# Patient Record
Sex: Male | Born: 1950
Health system: Southern US, Community
[De-identification: ages and names within clinical notes are randomized; demographics above are authoritative.]

## PROBLEM LIST (undated history)

## (undated) DIAGNOSIS — M1990 Unspecified osteoarthritis, unspecified site: Secondary | ICD-10-CM

## (undated) DIAGNOSIS — E559 Vitamin D deficiency, unspecified: Secondary | ICD-10-CM

## (undated) DIAGNOSIS — K5790 Diverticulosis of intestine, part unspecified, without perforation or abscess without bleeding: Secondary | ICD-10-CM

## (undated) DIAGNOSIS — I499 Cardiac arrhythmia, unspecified: Secondary | ICD-10-CM

## (undated) DIAGNOSIS — I10 Essential (primary) hypertension: Secondary | ICD-10-CM

## (undated) DIAGNOSIS — G4730 Sleep apnea, unspecified: Secondary | ICD-10-CM

## (undated) DIAGNOSIS — M109 Gout, unspecified: Secondary | ICD-10-CM

## (undated) DIAGNOSIS — E785 Hyperlipidemia, unspecified: Secondary | ICD-10-CM

## (undated) DIAGNOSIS — K219 Gastro-esophageal reflux disease without esophagitis: Secondary | ICD-10-CM

## (undated) HISTORY — DX: Gastro-esophageal reflux disease without esophagitis: K21.9

## (undated) HISTORY — DX: Unspecified osteoarthritis, unspecified site: M19.90

## (undated) HISTORY — DX: Sleep apnea, unspecified: G47.30

## (undated) HISTORY — DX: Cardiac arrhythmia, unspecified: I49.9

## (undated) HISTORY — DX: Diverticulosis of intestine, part unspecified, without perforation or abscess without bleeding: K57.90

## (undated) HISTORY — DX: Essential (primary) hypertension: I10

## (undated) HISTORY — DX: Hyperlipidemia, unspecified: E78.5

## (undated) HISTORY — DX: Vitamin D deficiency, unspecified: E55.9

---

## 1991-01-18 HISTORY — PX: EYE SURGERY: SHX253

## 2005-01-21 ENCOUNTER — Emergency Department (HOSPITAL_COMMUNITY): Admission: EM | Admit: 2005-01-21 | Discharge: 2005-01-21 | Payer: Self-pay | Admitting: Emergency Medicine

## 2005-05-02 ENCOUNTER — Emergency Department (HOSPITAL_COMMUNITY): Admission: EM | Admit: 2005-05-02 | Discharge: 2005-05-02 | Payer: Self-pay | Admitting: Family Medicine

## 2008-07-31 ENCOUNTER — Emergency Department (HOSPITAL_COMMUNITY): Admission: EM | Admit: 2008-07-31 | Discharge: 2008-07-31 | Payer: Self-pay | Admitting: Emergency Medicine

## 2010-03-13 ENCOUNTER — Inpatient Hospital Stay (INDEPENDENT_AMBULATORY_CARE_PROVIDER_SITE_OTHER)
Admission: RE | Admit: 2010-03-13 | Discharge: 2010-03-13 | Disposition: A | Discharge status: Home / Self Care | Payer: BC Managed Care – PPO | Source: Ambulatory Visit | Attending: Family Medicine | Admitting: Family Medicine

## 2010-03-13 DIAGNOSIS — L0291 Cutaneous abscess, unspecified: Secondary | ICD-10-CM

## 2010-03-13 DIAGNOSIS — L723 Sebaceous cyst: Secondary | ICD-10-CM

## 2013-02-28 ENCOUNTER — Encounter: Payer: Self-pay | Admitting: Gastroenterology

## 2013-03-25 ENCOUNTER — Ambulatory Visit (AMBULATORY_SURGERY_CENTER): Payer: Self-pay

## 2013-03-25 VITALS — Ht 65.0 in | Wt 185.8 lb

## 2013-03-25 DIAGNOSIS — Z800 Family history of malignant neoplasm of digestive organs: Secondary | ICD-10-CM

## 2013-03-25 MED ORDER — MOVIPREP 100 G PO SOLR: 1.0000 | Freq: Once | ORAL | Status: DC

## 2013-04-08 ENCOUNTER — Encounter: Payer: Self-pay | Admitting: Gastroenterology

## 2013-04-08 ENCOUNTER — Ambulatory Visit (AMBULATORY_SURGERY_CENTER): Payer: PRIVATE HEALTH INSURANCE | Admitting: Gastroenterology

## 2013-04-08 VITALS — BP 117/97 | HR 69 | Temp 97.1°F | Resp 12 | Ht 65.0 in | Wt 185.0 lb

## 2013-04-08 DIAGNOSIS — Z800 Family history of malignant neoplasm of digestive organs: Secondary | ICD-10-CM

## 2013-04-08 DIAGNOSIS — K5730 Diverticulosis of large intestine without perforation or abscess without bleeding: Secondary | ICD-10-CM

## 2013-04-08 DIAGNOSIS — Z1211 Encounter for screening for malignant neoplasm of colon: Secondary | ICD-10-CM

## 2013-04-08 MED ORDER — SODIUM CHLORIDE 0.9 % IV SOLN: 500.0000 mL | INTRAVENOUS | Status: DC

## 2013-04-08 NOTE — Progress Notes (Signed)
Report to pacu, rn, vss, bbs=clear

## 2013-04-08 NOTE — Op Note (Signed)
[facility] [address] [place], [id number]   COLONOSCOPY PROCEDURE REPORT  PATIENT: Cherie OuchRoach, Steven  MR#: [id number] BIRTHDATE: 10/10/1950 , 63  yrs. old GENDER: Male ENDOSCOPIST: Rachael Feeaniel P Majesty Stehlin, MD REFERRED QI:ONGEXBMBY:Richard Ricki MillerPang, M.D. PROCEDURE DATE:  04/08/2013 PROCEDURE:   Colonoscopy, screening First Screening Colonoscopy - Avg.  risk and is 50 yrs.  old or older - No.  Prior Negative Screening - Now for repeat screening. N/A  History of Adenoma - Now for follow-up colonoscopy & has been > or = to 3 yrs.  N/A  Polyps Removed Today? No.  Recommend repeat exam, <10 yrs? Yes.  High risk (family or personal hx). ASA CLASS:   Class II INDICATIONS:elevated risk screening, father had colon cancer MEDICATIONS: MAC sedation, administered by CRNA and Propofol (Diprivan) 230 mg IV  DESCRIPTION OF PROCEDURE:   After the risks benefits and alternatives of the procedure were thoroughly explained, informed consent was obtained.  A digital rectal exam revealed no abnormalities of the rectum.   The LB [id number]  endoscope was introduced through the anus and advanced to the cecum, which was identified by both the appendix and ileocecal valve. No adverse events experienced.   The quality of the prep was excellent.  The instrument was then slowly withdrawn as the colon was fully examined.  COLON FINDINGS: There were a few small diverticulum in the left colon.  The examination was otherwise normal.  Retroflexed views revealed no abnormalities. The time to cecum=0 minutes 52 seconds. Withdrawal time=8 minutes 02 seconds.  The scope was withdrawn and the procedure completed. COMPLICATIONS: There were no complications.  ENDOSCOPIC IMPRESSION: There were a few small diverticulum in the left colon. The examination was otherwise normal.  RECOMMENDATIONS: Given your significant family history of colon cancer (father), you should have a repeat colonoscopy in 5  years   eSigned:  Rachael Feeaniel P Sharia Averitt, MD 04/08/2013 11:09 AM

## 2013-04-08 NOTE — Patient Instructions (Signed)
YOU HAD AN ENDOSCOPIC PROCEDURE TODAY AT THE [facility]: Refer to the procedure report that was given to you for any specific questions about what was found during the examination.  If the procedure report does not answer your questions, please call your gastroenterologist to clarify.  If you requested that your care partner not be given the details of your procedure findings, then the procedure report has been included in a sealed envelope for you to review at your convenience later.  YOU SHOULD EXPECT: Some feelings of bloating in the abdomen. Passage of more gas than usual.  Walking can help get rid of the air that was put into your GI tract during the procedure and reduce the bloating. If you had a lower endoscopy (such as a colonoscopy or flexible sigmoidoscopy) you may notice spotting of blood in your stool or on the toilet paper. If you underwent a bowel prep for your procedure, then you may not have a normal bowel movement for a few days.  DIET: Your first meal following the procedure should be a light meal and then it is ok to progress to your normal diet.  A half-sandwich or bowl of soup is an example of a good first meal.  Heavy or fried foods are harder to digest and may make you feel nauseous or bloated.  Likewise meals heavy in dairy and vegetables can cause extra gas to form and this can also increase the bloating.  Drink plenty of fluids but you should avoid alcoholic beverages for 24 hours.  ACTIVITY: Your care partner should take you home directly after the procedure.  You should plan to take it easy, moving slowly for the rest of the day.  You can resume normal activity the day after the procedure however you should NOT DRIVE or use heavy machinery for 24 hours (because of the sedation medicines used during the test).    SYMPTOMS TO REPORT IMMEDIATELY: A gastroenterologist can be reached at any hour.  During normal business hours, 8:30 AM to 5:00 PM Monday through Friday,  call [phone].  After hours and on weekends, please call the GI answering service at [phone] who will take a message and have the physician on call contact you.   Following lower endoscopy (colonoscopy or flexible sigmoidoscopy):  Excessive amounts of blood in the stool  Significant tenderness or worsening of abdominal pains  Swelling of the abdomen that is new, acute  Fever of 100F or higher  FOLLOW UP: If any biopsies were taken you will be contacted by phone or by letter within the next 1-3 weeks.  Call your gastroenterologist if you have not heard about the biopsies in 3 weeks.  Our staff will call the home number listed on your records the next business day following your procedure to check on you and address any questions or concerns that you may have at that time regarding the information given to you following your procedure. This is a courtesy call and so if there is no answer at the home number and we have not heard from you through the emergency physician on call, we will assume that you have returned to your regular daily activities without incident.  SIGNATURES/CONFIDENTIALITY: You and/or your care partner have signed paperwork which will be entered into your electronic medical record.  These signatures attest to the fact that that the information above on your After Visit Summary has been reviewed and is understood.  Full responsibility of the confidentiality of this   discharge information lies with you and/or your care-partner.  Resume medications. Information given on diverticulosis and high fiber diet with discharge instructions. 

## 2013-04-09 ENCOUNTER — Telehealth: Payer: Self-pay

## 2013-04-09 NOTE — Telephone Encounter (Signed)
  Follow up Call-  Call back number 04/08/2013  Post procedure Call Back phone  # [phone]  Permission to leave phone message Yes     Patient questions:  Do you have a fever, pain , or abdominal swelling? no Pain Score  0 *  Have you tolerated food without any problems? yes  Have you been able to return to your normal activities? yes  Do you have any questions about your discharge instructions: Diet   no Medications  no Follow up visit  no  Do you have questions or concerns about your Care? no  Actions: * If pain score is 4 or above: No action needed, pain <4.

## 2014-08-16 ENCOUNTER — Encounter (HOSPITAL_COMMUNITY): Payer: Self-pay | Admitting: Emergency Medicine

## 2014-08-16 ENCOUNTER — Emergency Department (HOSPITAL_COMMUNITY)
Admission: EM | Admit: 2014-08-16 | Discharge: 2014-08-16 | Disposition: A | Discharge status: Home / Self Care | Payer: 59 | Attending: Emergency Medicine | Admitting: Emergency Medicine

## 2014-08-16 DIAGNOSIS — E559 Vitamin D deficiency, unspecified: Secondary | ICD-10-CM | POA: Diagnosis not present

## 2014-08-16 DIAGNOSIS — Z87891 Personal history of nicotine dependence: Secondary | ICD-10-CM | POA: Insufficient documentation

## 2014-08-16 DIAGNOSIS — M109 Gout, unspecified: Secondary | ICD-10-CM | POA: Diagnosis present

## 2014-08-16 DIAGNOSIS — I10 Essential (primary) hypertension: Secondary | ICD-10-CM | POA: Insufficient documentation

## 2014-08-16 DIAGNOSIS — M10072 Idiopathic gout, left ankle and foot: Secondary | ICD-10-CM | POA: Insufficient documentation

## 2014-08-16 DIAGNOSIS — Z79899 Other long term (current) drug therapy: Secondary | ICD-10-CM | POA: Insufficient documentation

## 2014-08-16 HISTORY — DX: Gout, unspecified: M10.9

## 2014-08-16 MED ORDER — IBUPROFEN 400 MG PO TABS
800.0000 mg | ORAL_TABLET | Freq: Once | ORAL | Status: AC
  Administered 2014-08-16: 800 mg via ORAL
  Filled 2014-08-16: qty 2

## 2014-08-16 MED ORDER — IBUPROFEN 600 MG PO TABS: 600.0000 mg | ORAL_TABLET | Freq: Two times a day (BID) | ORAL | Status: DC

## 2014-08-16 NOTE — ED Provider Notes (Signed)
CSN: [id number]     Arrival date & time 08/16/14  0700 History   First MD Initiated Contact with Patient 08/16/14 [phone]     Chief Complaint  Patient presents with  . Gout   HPI   64 year old male presents today with left great toe pain. Patient reports a significant past medical history of gout, was prescribed allopurinol by his primary care provider. Patient reports that for the last 3 days he's been experiencing excruciating left toe pain, described as stabbing, extremely painful to palpation or light touch. Patient reports this is identical to previous gout flares which she's had in the great toe. He notes that the allopurinol that his primary care provider prescribed him has not improved symptoms. Patient has not tried any other over-the-counter therapies. He denies fever, chills, nausea, vomiting, or any other significant acute concerning signs or symptoms today.    Past Medical History  Diagnosis Date  . Hypertension   . Vitamin D deficiency   . Gout    Past Surgical History  Procedure Laterality Date  . Eye surgery  1993    due to injury   Family History  Problem Relation Age of Onset  . Colon cancer Father    History  Substance Use Topics  . Smoking status: Former Games developer  . Smokeless tobacco: Never Used  . Alcohol Use: Yes     Comment: 2 times monthly "if that"    Review of Systems  All other systems reviewed and are negative.   Allergies  Review of patient's allergies indicates no known allergies.  Home Medications   Prior to Admission medications   Medication Sig Start Date End Date Taking? Authorizing Provider  Acetaminophen (TYLENOL ARTHRITIS PAIN PO) Take by mouth.    Historical Provider, MD  ibuprofen (ADVIL,MOTRIN) 600 MG tablet Take 1 tablet (600 mg total) by mouth 2 (two) times daily. 08/16/14   Eyvonne Mechanic, PA-C  olmesartan-hydrochlorothiazide (BENICAR HCT) 40-12.5 MG per tablet Take 1 tablet by mouth daily.    Historical Provider, MD  PRESCRIPTION  MEDICATION Vitamin d3 once monthly    Historical Provider, MD   BP 152/79 mmHg  Pulse 50  Temp(Src) 98.1 F (36.7 C) (Oral)  Resp 16  Ht  (1.651 m)  Wt 189 lb (85.73 kg)  BMI 31.45 kg/m2  SpO2 100%   Physical Exam  Constitutional: He is oriented to person, place, and time. He appears well-developed and well-nourished.  HENT:  Head: Normocephalic and atraumatic.  Eyes: Conjunctivae are normal. Pupils are equal, round, and reactive to light. Right eye exhibits no discharge. Left eye exhibits no discharge. No scleral icterus.  Neck: Normal range of motion. No JVD present. No tracheal deviation present.  Pulmonary/Chest: Effort normal. No stridor.  Musculoskeletal:  Mildly erythematous left great MTP, no obvious signs of swelling or soft tissue infection, Refill less than 3 seconds, tender to light palpation. Ankle nontender full active range of motion  Neurological: He is alert and oriented to person, place, and time. Coordination normal.  Psychiatric: He has a normal mood and affect. His behavior is normal. Judgment and thought content normal.  Nursing note and vitals reviewed.   ED Course  Procedures (including critical care time) Labs Review Labs Reviewed - No data to display  Imaging Review No results found.   EKG Interpretation None      MDM   Final diagnoses:  Acute gout of left foot, unspecified cause    Labs:  Imaging:  Consults:  Therapeutics: Ibuprofen  Discharge Meds:   Assessment/Plan: Patient presents with likely gout flare. He presented his allopurinol which she reports he's been taking for the flare, but notes that he has not been taking it on a daily basis. I informed him that this medication is only intended for daily use and not for acute flares. This is unlikely septic joint, cellulitis, or any other concerning pathology. Patient will be prescribed ibuprofen twice a day for 5 days, encouraged to discontinue allopurinol until symptoms have  resolved. His encouraged contact his primary care provider in 2 days for reevaluation. Strict return precautions given patient verbalizes understanding and agreement to today's plan and has no further questions concerns at the time of discharge.         Eyvonne Mechanic, PA-C 08/16/14 1610  Elwin Mocha, MD 08/16/14 [phone]

## 2014-08-16 NOTE — Discharge Instructions (Signed)

## 2014-08-16 NOTE — ED Notes (Signed)
Pt. Stated, I started having gou on Thursday , left foot.

## 2015-01-19 ENCOUNTER — Emergency Department (INDEPENDENT_AMBULATORY_CARE_PROVIDER_SITE_OTHER)
Admission: EM | Admit: 2015-01-19 | Discharge: 2015-01-19 | Disposition: A | Discharge status: Home / Self Care | Payer: Managed Care, Other (non HMO) | Source: Home / Self Care | Attending: Family Medicine | Admitting: Family Medicine

## 2015-01-19 ENCOUNTER — Encounter (HOSPITAL_COMMUNITY): Payer: Self-pay

## 2015-01-19 DIAGNOSIS — H53143 Visual discomfort, bilateral: Secondary | ICD-10-CM

## 2015-01-19 DIAGNOSIS — G44209 Tension-type headache, unspecified, not intractable: Secondary | ICD-10-CM

## 2015-01-19 DIAGNOSIS — H5713 Ocular pain, bilateral: Secondary | ICD-10-CM

## 2015-01-19 DIAGNOSIS — H5310 Unspecified subjective visual disturbances: Secondary | ICD-10-CM

## 2015-01-19 DIAGNOSIS — S161XXA Strain of muscle, fascia and tendon at neck level, initial encounter: Secondary | ICD-10-CM | POA: Diagnosis not present

## 2015-01-19 MED ORDER — DICLOFENAC SODIUM 1 % TD GEL: TRANSDERMAL | Status: DC

## 2015-01-19 MED ORDER — NAPROXEN 375 MG PO TABS: 375.0000 mg | ORAL_TABLET | Freq: Two times a day (BID) | ORAL | Status: DC

## 2015-01-19 NOTE — ED Notes (Signed)
Patient complains of headache neck pain and bilateral eye irritation Symptoms started about three weeks ago and his primary told him to take Allegra for two weeks and see if that helps Patient feels he is not any better

## 2015-01-19 NOTE — Discharge Instructions (Signed)
Cervical Strain and Sprain With Rehab  Cervical strain and sprain are injuries that commonly occur with "whiplash" injuries. Whiplash occurs when the neck is forcefully whipped backward or forward, such as during a motor vehicle accident or during contact sports. The muscles, ligaments, tendons, discs, and nerves of the neck are susceptible to injury when this occurs.  RISK FACTORS  Risk of having a whiplash injury increases if:  · Osteoarthritis of the spine.  · Situations that make head or neck accidents or trauma more likely.  · High-risk sports (football, rugby, wrestling, hockey, auto racing, gymnastics, diving, contact karate, or boxing).  · Poor strength and flexibility of the neck.  · Previous neck injury.  · Poor tackling technique.  · Improperly fitted or padded equipment.  SYMPTOMS   · Pain or stiffness in the front or back of neck or both.  · Symptoms may present immediately or up to 24 hours after injury.  · Dizziness, headache, nausea, and vomiting.  · Muscle spasm with soreness and stiffness in the neck.  · Tenderness and swelling at the injury site.  PREVENTION  · Learn and use proper technique (avoid tackling with the head, spearing, and head-butting; use proper falling techniques to avoid landing on the head).  · Warm up and stretch properly before activity.  · Maintain physical fitness:    Strength, flexibility, and endurance.    Cardiovascular fitness.  · Wear properly fitted and padded protective equipment, such as padded soft collars, for participation in contact sports.  PROGNOSIS   Recovery from cervical strain and sprain injuries is dependent on the extent of the injury. These injuries are usually curable in 1 week to 3 months with appropriate treatment.   RELATED COMPLICATIONS   · Temporary numbness and weakness may occur if the nerve roots are damaged, and this may persist until the nerve has completely healed.  · Chronic pain due to frequent recurrence of symptoms.  · Prolonged healing,  especially if activity is resumed too soon (before complete recovery).  TREATMENT   Treatment initially involves the use of ice and medication to help reduce pain and inflammation. It is also important to perform strengthening and stretching exercises and modify activities that worsen symptoms so the injury does not get worse. These exercises may be performed at home or with a therapist. For patients who experience severe symptoms, a soft, padded collar may be recommended to be worn around the neck.   Improving your posture may help reduce symptoms. Posture improvement includes pulling your chin and abdomen in while sitting or standing. If you are sitting, sit in a firm chair with your buttocks against the back of the chair. While sleeping, try replacing your pillow with a small towel rolled to 2 inches in diameter, or use a cervical pillow or soft cervical collar. Poor sleeping positions delay healing.   For patients with nerve root damage, which causes numbness or weakness, the use of a cervical traction apparatus may be recommended. Surgery is rarely necessary for these injuries. However, cervical strain and sprains that are present at birth (congenital) may require surgery.  MEDICATION   · If pain medication is necessary, nonsteroidal anti-inflammatory medications, such as aspirin and ibuprofen, or other minor pain relievers, such as acetaminophen, are often recommended.  · Do not take pain medication for 7 days before surgery.  · Prescription pain relievers may be given if deemed necessary by your caregiver. Use only as directed and only as much as you need.    HEAT AND COLD:   · Cold treatment (icing) relieves pain and reduces inflammation. Cold treatment should be applied for 10 to 15 minutes every 2 to 3 hours for inflammation and pain and immediately after any activity that aggravates your symptoms. Use ice packs or an ice massage.  · Heat treatment may be used prior to performing the stretching and  strengthening activities prescribed by your caregiver, physical therapist, or athletic trainer. Use a heat pack or a warm soak.  SEEK MEDICAL CARE IF:   · Symptoms get worse or do not improve in 2 weeks despite treatment.  · New, unexplained symptoms develop (drugs used in treatment may produce side effects).  EXERCISES  RANGE OF MOTION (ROM) AND STRETCHING EXERCISES - Cervical Strain and Sprain  These exercises may help you when beginning to rehabilitate your injury. In order to successfully resolve your symptoms, you must improve your posture. These exercises are designed to help reduce the forward-head and rounded-shoulder posture which contributes to this condition. Your symptoms may resolve with or without further involvement from your physician, physical therapist or athletic trainer. While completing these exercises, remember:   · Restoring tissue flexibility helps normal motion to return to the joints. This allows healthier, less painful movement and activity.  · An effective stretch should be held for at least 20 seconds, although you may need to begin with shorter hold times for comfort.  · A stretch should never be painful. You should only feel a gentle lengthening or release in the stretched tissue.  STRETCH- Axial Extensors  · Lie on your back on the floor. You may bend your knees for comfort. Place a rolled-up hand towel or dish towel, about 2 inches in diameter, under the part of your head that makes contact with the floor.  · Gently tuck your chin, as if trying to make a "double chin," until you feel a gentle stretch at the base of your head.  · Hold __________ seconds.  Repeat __________ times. Complete this exercise __________ times per day.   STRETCH - Axial Extension   · Stand or sit on a firm surface. Assume a good posture: chest up, shoulders drawn back, abdominal muscles slightly tense, knees unlocked (if standing) and feet hip width apart.  · Slowly retract your chin so your head slides back  and your chin slightly lowers. Continue to look straight ahead.  · You should feel a gentle stretch in the back of your head. Be certain not to feel an aggressive stretch since this can cause headaches later.  · Hold for __________ seconds.  Repeat __________ times. Complete this exercise __________ times per day.  STRETCH - Cervical Side Bend   · Stand or sit on a firm surface. Assume a good posture: chest up, shoulders drawn back, abdominal muscles slightly tense, knees unlocked (if standing) and feet hip width apart.  · Without letting your nose or shoulders move, slowly tip your right / left ear to your shoulder until your feel a gentle stretch in the muscles on the opposite side of your neck.  · Hold __________ seconds.  Repeat __________ times. Complete this exercise __________ times per day.  STRETCH - Cervical Rotators   · Stand or sit on a firm surface. Assume a good posture: chest up, shoulders drawn back, abdominal muscles slightly tense, knees unlocked (if standing) and feet hip width apart.  · Keeping your eyes level with the ground, slowly turn your head until you feel a gentle stretch along   the back and opposite side of your neck.  · Hold __________ seconds.  Repeat __________ times. Complete this exercise __________ times per day.  RANGE OF MOTION - Neck Circles   · Stand or sit on a firm surface. Assume a good posture: chest up, shoulders drawn back, abdominal muscles slightly tense, knees unlocked (if standing) and feet hip width apart.  · Gently roll your head down and around from the back of one shoulder to the back of the other. The motion should never be forced or painful.  · Repeat the motion 10-20 times, or until you feel the neck muscles relax and loosen.  Repeat __________ times. Complete the exercise __________ times per day.  STRENGTHENING EXERCISES - Cervical Strain and Sprain  These exercises may help you when beginning to rehabilitate your injury. They may resolve your symptoms with or  without further involvement from your physician, physical therapist, or athletic trainer. While completing these exercises, remember:   · Muscles can gain both the endurance and the strength needed for everyday activities through controlled exercises.  · Complete these exercises as instructed by your physician, physical therapist, or athletic trainer. Progress the resistance and repetitions only as guided.  · You may experience muscle soreness or fatigue, but the pain or discomfort you are trying to eliminate should never worsen during these exercises. If this pain does worsen, stop and make certain you are following the directions exactly. If the pain is still present after adjustments, discontinue the exercise until you can discuss the trouble with your clinician.  STRENGTH - Cervical Flexors, Isometric  · Face a wall, standing about 6 inches away. Place a small pillow, a ball about 6-8 inches in diameter, or a folded towel between your forehead and the wall.  · Slightly tuck your chin and gently push your forehead into the soft object. Push only with mild to moderate intensity, building up tension gradually. Keep your jaw and forehead relaxed.  · Hold 10 to 20 seconds. Keep your breathing relaxed.  · Release the tension slowly. Relax your neck muscles completely before you start the next repetition.  Repeat __________ times. Complete this exercise __________ times per day.  STRENGTH- Cervical Lateral Flexors, Isometric   · Stand about 6 inches away from a wall. Place a small pillow, a ball about 6-8 inches in diameter, or a folded towel between the side of your head and the wall.  · Slightly tuck your chin and gently tilt your head into the soft object. Push only with mild to moderate intensity, building up tension gradually. Keep your jaw and forehead relaxed.  · Hold 10 to 20 seconds. Keep your breathing relaxed.  · Release the tension slowly. Relax your neck muscles completely before you start the next  repetition.  Repeat __________ times. Complete this exercise __________ times per day.  STRENGTH - Cervical Extensors, Isometric   · Stand about 6 inches away from a wall. Place a small pillow, a ball about 6-8 inches in diameter, or a folded towel between the back of your head and the wall.  · Slightly tuck your chin and gently tilt your head back into the soft object. Push only with mild to moderate intensity, building up tension gradually. Keep your jaw and forehead relaxed.  · Hold 10 to 20 seconds. Keep your breathing relaxed.  · Release the tension slowly. Relax your neck muscles completely before you start the next repetition.  Repeat __________ times. Complete this exercise __________ times per day.    All of your joints have less wear and tear when properly supported by a spine with good posture. This means you will experience a healthier, less painful body.  Correct posture must be practiced with all of your activities, especially prolonged sitting and standing. Correct posture is as important when doing repetitive low-stress activities (typing) as it is when doing a single heavy-load activity (lifting). PROLONGED STANDING WHILE SLIGHTLY LEANING FORWARD When completing a task that requires you to lean forward while standing in one  place for a long time, place either foot up on a stationary 2- to 4-inch high object to help maintain the best posture. When both feet are on the ground, the low back tends to lose its slight inward curve. If this curve flattens (or becomes too large), then the back and your other joints will experience too much stress, fatigue more quickly, and can cause pain.  RESTING POSITIONS Consider which positions are most painful for you when choosing a resting position. If you have pain with flexion-based activities (sitting, bending, stooping, squatting), choose a position that allows you to rest in a less flexed posture. You would want to avoid curling into a fetal position on your side. If your pain worsens with extension-based activities (prolonged standing, working overhead), avoid resting in an extended position such as sleeping on your stomach. Most people will find more comfort when they rest with their spine in a more neutral position, neither too rounded nor too arched. Lying on a non-sagging bed on your side with a pillow between your knees, or on your back with a pillow under your knees will often provide some relief. Keep in mind, being in any one position for a prolonged period of time, no matter how correct your posture, can still lead to stiffness. WALKING Walk with an upright posture. Your ears, shoulders, and hips should all line up. OFFICE WORK When working at a desk, create an environment that supports good, upright posture. Without extra support, muscles fatigue and lead to excessive strain on joints and other tissues. CHAIR:  A chair should be able to slide under your desk when your back makes contact with the back of the chair. This allows you to work closely.  The chair's height should allow your eyes to be level with the upper part of your monitor and your hands to be slightly lower than your elbows.  Body position:  Your feet should make contact with the floor. If this is not  possible, use a foot rest.  Keep your ears over your shoulders. This will reduce stress on your neck and low back.   This information is not intended to replace advice given to you by your health care provider. Make sure you discuss any questions you have with your health care provider.   Document Released: 01/03/2005 Document Revised: 01/24/2014 Document Reviewed: 04/17/2008 [employer] Interactive Patient Education 2016 [employer]  Muscle Strain A muscle strain (pulled muscle) happens when a muscle is stretched beyond normal length. It happens when a sudden, violent force stretches your muscle too far. Usually, a few of the fibers in your muscle are torn. Muscle strain is common in athletes. Recovery usually takes 1-2 weeks. Complete healing takes 5-6 weeks.  HOME CARE   Follow the PRICE method of treatment to help your injury get better. Do this the first 2-3 days after the injury:  Protect. Protect the muscle to keep it from getting injured again.  Rest. Limit your activity and rest the injured body  part.  Ice. Put ice in a plastic bag. Place a towel between your skin and the bag. Then, apply the ice and leave it on from 15-20 minutes each hour. After the third day, switch to moist heat packs.  Compression. Use a splint or elastic bandage on the injured area for comfort. Do not put it on too tightly.  Elevate. Keep the injured body part above the level of your heart.  Only take medicine as told by your doctor.  Warm up before doing exercise to prevent future muscle strains. GET HELP IF:   You have more pain or puffiness (swelling) in the injured area.  You feel numbness, tingling, or notice a loss of strength in the injured area. MAKE SURE YOU:   Understand these instructions.  Will watch your condition.  Will get help right away if you are not doing well or get worse.   This information is not intended to replace advice given to you by your health care provider. Make  sure you discuss any questions you have with your health care provider.   Document Released: 10/13/2007 Document Revised: 10/24/2012 Document Reviewed: 08/02/2012 [employer] Interactive Patient Education 2016 [employer]  Tension Headache A tension headache is a feeling of pain, pressure, or aching that is often felt over the front and sides of the head. The pain can be dull, or it can feel tight (constricting). Tension headaches are not normally associated with nausea or vomiting, and they do not get worse with physical activity. Tension headaches can last from 30 minutes to several days. This is the most common type of headache. CAUSES The exact cause of this condition is not known. Tension headaches often begin after stress, anxiety, or depression. Other triggers may include:  Alcohol.  Too much caffeine, or caffeine withdrawal.  Respiratory infections, such as colds, flu, or sinus infections.  Dental problems or teeth clenching.  Fatigue.  Holding your head and neck in the same position for a long period of time, such as while using a computer.  Smoking. SYMPTOMS Symptoms of this condition include:  A feeling of pressure around the head.  Dull, aching head pain.  Pain felt over the front and sides of the head.  Tenderness in the muscles of the head, neck, and shoulders. DIAGNOSIS This condition may be diagnosed based on your symptoms and a physical exam. Tests may be done, such as a CT scan or an MRI of your head. These tests may be done if your symptoms are severe or unusual. TREATMENT This condition may be treated with lifestyle changes and medicines to help relieve symptoms. HOME CARE INSTRUCTIONS Managing Pain  Take over-the-counter and prescription medicines only as told by your health care provider.  Lie down in a dark, quiet room when you have a headache.  If directed, apply ice to the head and neck area:  Put ice in a plastic bag.  Place a towel between  your skin and the bag.  Leave the ice on for 20 minutes, 2-3 times per day.  Use a heating pad or a hot shower to apply heat to the head and neck area as told by your health care provider. Eating and Drinking  Eat meals on a regular schedule.  Limit alcohol use.  Decrease your caffeine intake, or stop using caffeine. General Instructions  Keep all follow-up visits as told by your health care provider. This is important.  Keep a headache journal to help find out what may trigger your headaches.  For example, write down:  What you eat and drink.  How much sleep you get.  Any change to your diet or medicines.  Try massage or other relaxation techniques.  Limit stress.  Sit up straight, and avoid tensing your muscles.  Do not use tobacco products, including cigarettes, chewing tobacco, or e-cigarettes. If you need help quitting, ask your health care provider.  Exercise regularly as told by your health care provider.  Get 7-9 hours of sleep, or the amount recommended by your health care provider. SEEK MEDICAL CARE IF:  Your symptoms are not helped by medicine.  You have a headache that is different from what you normally experience.  You have nausea or you vomit.  You have a fever. SEEK IMMEDIATE MEDICAL CARE IF:  Your headache becomes severe.  You have repeated vomiting.  You have a stiff neck.  You have a loss of vision.  You have problems with speech.  You have pain in your eye or ear.  You have muscular weakness or loss of muscle control.  You lose your balance or you have trouble walking.  You feel faint or you pass out.  You have confusion.   This information is not intended to replace advice given to you by your health care provider. Make sure you discuss any questions you have with your health care provider.   Document Released: 01/03/2005 Document Revised: 09/24/2014 Document Reviewed: 04/28/2014 [employer] Interactive Patient Education [id number]  [employer]

## 2015-01-19 NOTE — ED Provider Notes (Signed)
CSN: [id number]     Arrival date & time 01/19/15  1755 History   First MD Initiated Contact with Patient 01/19/15 1942     Chief Complaint  Patient presents with  . Headache  . Neck Pain   (Consider location/radiation/quality/duration/timing/severity/associated sxs/prior Treatment) HPI Comments: 65 year old male complaining of pain around the eyes, across the forehead and in the bilateral neck for 3 weeks. He called his PCP approximately 3 weeks ago and was told that it might be due to sinuses and to take Allegra to see if that helps. He says that is not helping at all. He states the symptoms occur primarily when he drives. He is a Naval architect. The pain in his eyes is primarily just below the orbital ridge. There is pain and tenderness across the forehead as well as the occipital and posterior neck. Denies stiffness. He demonstrates full range of motion. He denies problems with vision. He does have a history of left eye injury in which his lens was partially destroyed and is malformed. He wears a contact lens on the left eye 4 patient redness. He denies diplopia or vision problems. 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  Patient is a 65 y.o. male presenting with headaches and neck pain.  Headache Associated symptoms: eye pain, neck pain and photophobia   Associated symptoms: no congestion, no cough, no dizziness, no ear pain, no fatigue, no fever, no seizures, no sinus pressure and no sore throat   Neck Pain Associated symptoms: headaches and photophobia   Associated symptoms: no chest pain and no fever     Past Medical History  Diagnosis Date  . Hypertension   . Vitamin D deficiency   . Gout    Past Surgical History  Procedure Laterality Date  . Eye surgery  1993    due to injury   Family History  Problem Relation Age of Onset  . Colon cancer Father    Social History  Substance Use Topics  . Smoking status: Former Games developer  .  Smokeless tobacco: Never Used  . Alcohol Use: Yes     Comment: 2 times monthly "if that"    Review of Systems  Constitutional: Negative for fever, activity change and fatigue.  HENT: Negative for congestion, ear pain, rhinorrhea, sinus pressure and sore throat.   Eyes: Positive for photophobia, pain and redness. Negative for discharge, itching and visual disturbance.  Respiratory: Negative for cough and shortness of breath.   Cardiovascular: Negative for chest pain.  Gastrointestinal: Negative.   Genitourinary: Negative.   Musculoskeletal: Positive for neck pain.  Skin: Negative for rash.  Neurological: Positive for headaches. Negative for dizziness, tremors, seizures, syncope, facial asymmetry, speech difficulty and light-headedness.  Psychiatric/Behavioral: Negative.     Allergies  Review of patient's allergies indicates no known allergies.  Home Medications   Prior to Admission medications   Medication Sig Start Date End Date Taking? Authorizing Provider  Acetaminophen (TYLENOL ARTHRITIS PAIN PO) Take by mouth.    Historical Provider, MD  diclofenac sodium (VOLTAREN) 1 % GEL Apply the gel to the forehead and around the neck 4 times a day as needed for pain. 01/19/15   Hayden Rasmussen, NP  naproxen (NAPROSYN) 375 MG tablet Take 1 tablet (375 mg total) by mouth 2 (two) times daily. 01/19/15   Hayden Rasmussen, NP  olmesartan-hydrochlorothiazide (BENICAR HCT) 40-12.5 MG per tablet Take 1 tablet by mouth daily.    Historical  Provider, MD  PRESCRIPTION MEDICATION Vitamin d3 once monthly    Historical Provider, MD   Meds Ordered and Administered this Visit  Medications - No data to display  BP 134/90 mmHg  Pulse 73  Temp(Src) 97.7 F (36.5 C) (Oral)  Resp 16  SpO2 99% No data found.   Physical Exam  Constitutional: He is oriented to person, place, and time. He appears well-developed and well-nourished.  HENT:  Head: Normocephalic and atraumatic.  Bilateral TMs are normal Oropharynx is  clear. Tenderness across the forehead musculature.  Eyes: EOM are normal.  Left pupil is misshapen due to prior injury. Right pupil round and reactive to light. There is minor scleral erythema to the right eye. Mild bilateral conjunctival erythema.  Neck: Normal range of motion. Neck supple.  Tenderness to the posterior and bilateral paracervical musculature. This reproduces the pain for which sh presents. No spinal tenderness, swelling or deformity.  Cardiovascular: Normal rate, regular rhythm and normal heart sounds.   Pulmonary/Chest: Effort normal and breath sounds normal.  Musculoskeletal: Normal range of motion. He exhibits no edema.  Neurological: He is alert and oriented to person, place, and time. He exhibits normal muscle tone. Coordination normal.  Skin: Skin is warm and dry. He is not diaphoretic.  Nursing note and vitals reviewed.   ED Course  Procedures (including critical care time)  Labs Review Labs Reviewed - No data to display  Imaging Review No results found.   Visual Acuity Review  Right Eye Distance:   Left Eye Distance:   Bilateral Distance:    Right Eye Near:   Left Eye Near:    Bilateral Near:  right eye 20/30 left eye did not do patient does not have his contact in      MDM   1. Muscle tension headache   2. Cervical muscle strain, initial encounter   3. Eye strain, bilateral   4. Eye pain, bilateral    Diclofenac gel to the forehead and around the back of the neck 4 times a day Naprosyn 375 twice a day with food when necessary Heat to the areas of muscle with pain Call your PCP and your eye doctor tomorrow for appointment.    Hayden Rasmussenavid Emaleigh Guimond, NP 01/19/15 2020

## 2015-07-27 DIAGNOSIS — H40231 Intermittent angle-closure glaucoma, right eye: Secondary | ICD-10-CM | POA: Diagnosis not present

## 2015-10-29 DIAGNOSIS — E7800 Pure hypercholesterolemia, unspecified: Secondary | ICD-10-CM | POA: Diagnosis not present

## 2015-10-29 DIAGNOSIS — M1A0790 Idiopathic chronic gout, unspecified ankle and foot, without tophus (tophi): Secondary | ICD-10-CM | POA: Diagnosis not present

## 2015-10-29 DIAGNOSIS — Z0001 Encounter for general adult medical examination with abnormal findings: Secondary | ICD-10-CM | POA: Diagnosis not present

## 2015-10-29 DIAGNOSIS — E782 Mixed hyperlipidemia: Secondary | ICD-10-CM | POA: Diagnosis not present

## 2015-10-29 DIAGNOSIS — I10 Essential (primary) hypertension: Secondary | ICD-10-CM | POA: Diagnosis not present

## 2015-10-29 DIAGNOSIS — K219 Gastro-esophageal reflux disease without esophagitis: Secondary | ICD-10-CM | POA: Diagnosis not present

## 2015-11-02 DIAGNOSIS — I10 Essential (primary) hypertension: Secondary | ICD-10-CM | POA: Diagnosis not present

## 2015-11-02 DIAGNOSIS — E782 Mixed hyperlipidemia: Secondary | ICD-10-CM | POA: Diagnosis not present

## 2015-11-02 DIAGNOSIS — M1A0790 Idiopathic chronic gout, unspecified ankle and foot, without tophus (tophi): Secondary | ICD-10-CM | POA: Diagnosis not present

## 2015-11-02 DIAGNOSIS — K219 Gastro-esophageal reflux disease without esophagitis: Secondary | ICD-10-CM | POA: Diagnosis not present

## 2016-05-30 DIAGNOSIS — M1A0790 Idiopathic chronic gout, unspecified ankle and foot, without tophus (tophi): Secondary | ICD-10-CM | POA: Diagnosis not present

## 2016-05-30 DIAGNOSIS — E782 Mixed hyperlipidemia: Secondary | ICD-10-CM | POA: Diagnosis not present

## 2016-06-06 DIAGNOSIS — E782 Mixed hyperlipidemia: Secondary | ICD-10-CM | POA: Diagnosis not present

## 2016-06-06 DIAGNOSIS — I10 Essential (primary) hypertension: Secondary | ICD-10-CM | POA: Diagnosis not present

## 2016-06-06 DIAGNOSIS — K219 Gastro-esophageal reflux disease without esophagitis: Secondary | ICD-10-CM | POA: Diagnosis not present

## 2016-11-14 DIAGNOSIS — I10 Essential (primary) hypertension: Secondary | ICD-10-CM | POA: Diagnosis not present

## 2016-11-14 DIAGNOSIS — Z0000 Encounter for general adult medical examination without abnormal findings: Secondary | ICD-10-CM | POA: Diagnosis not present

## 2016-11-14 DIAGNOSIS — Z125 Encounter for screening for malignant neoplasm of prostate: Secondary | ICD-10-CM | POA: Diagnosis not present

## 2016-11-14 DIAGNOSIS — E782 Mixed hyperlipidemia: Secondary | ICD-10-CM | POA: Diagnosis not present

## 2016-11-21 DIAGNOSIS — M1A0790 Idiopathic chronic gout, unspecified ankle and foot, without tophus (tophi): Secondary | ICD-10-CM | POA: Diagnosis not present

## 2016-11-21 DIAGNOSIS — I10 Essential (primary) hypertension: Secondary | ICD-10-CM | POA: Diagnosis not present

## 2016-11-21 DIAGNOSIS — E782 Mixed hyperlipidemia: Secondary | ICD-10-CM | POA: Diagnosis not present

## 2016-11-21 DIAGNOSIS — J309 Allergic rhinitis, unspecified: Secondary | ICD-10-CM | POA: Diagnosis not present

## 2016-11-21 DIAGNOSIS — K219 Gastro-esophageal reflux disease without esophagitis: Secondary | ICD-10-CM | POA: Diagnosis not present

## 2016-11-21 DIAGNOSIS — E559 Vitamin D deficiency, unspecified: Secondary | ICD-10-CM | POA: Diagnosis not present

## 2016-11-21 DIAGNOSIS — N529 Male erectile dysfunction, unspecified: Secondary | ICD-10-CM | POA: Diagnosis not present

## 2016-12-29 DIAGNOSIS — N5201 Erectile dysfunction due to arterial insufficiency: Secondary | ICD-10-CM | POA: Diagnosis not present

## 2017-02-03 DIAGNOSIS — N5201 Erectile dysfunction due to arterial insufficiency: Secondary | ICD-10-CM | POA: Diagnosis not present

## 2017-05-08 DIAGNOSIS — N5201 Erectile dysfunction due to arterial insufficiency: Secondary | ICD-10-CM | POA: Diagnosis not present

## 2017-06-05 DIAGNOSIS — E782 Mixed hyperlipidemia: Secondary | ICD-10-CM | POA: Diagnosis not present

## 2017-06-05 DIAGNOSIS — K219 Gastro-esophageal reflux disease without esophagitis: Secondary | ICD-10-CM | POA: Diagnosis not present

## 2017-06-05 DIAGNOSIS — M1A0790 Idiopathic chronic gout, unspecified ankle and foot, without tophus (tophi): Secondary | ICD-10-CM | POA: Diagnosis not present

## 2017-06-05 DIAGNOSIS — I10 Essential (primary) hypertension: Secondary | ICD-10-CM | POA: Diagnosis not present

## 2017-06-10 DIAGNOSIS — H52223 Regular astigmatism, bilateral: Secondary | ICD-10-CM | POA: Diagnosis not present

## 2017-06-10 DIAGNOSIS — H524 Presbyopia: Secondary | ICD-10-CM | POA: Diagnosis not present

## 2017-06-20 DIAGNOSIS — I10 Essential (primary) hypertension: Secondary | ICD-10-CM | POA: Diagnosis not present

## 2017-06-20 DIAGNOSIS — G4733 Obstructive sleep apnea (adult) (pediatric): Secondary | ICD-10-CM | POA: Diagnosis not present

## 2017-06-20 DIAGNOSIS — E782 Mixed hyperlipidemia: Secondary | ICD-10-CM | POA: Diagnosis not present

## 2017-06-20 DIAGNOSIS — M1A0790 Idiopathic chronic gout, unspecified ankle and foot, without tophus (tophi): Secondary | ICD-10-CM | POA: Diagnosis not present

## 2017-08-31 ENCOUNTER — Institutional Professional Consult (permissible substitution): Payer: Self-pay | Admitting: Neurology

## 2017-10-02 ENCOUNTER — Encounter: Payer: Self-pay | Admitting: Neurology

## 2017-10-02 ENCOUNTER — Ambulatory Visit: Payer: Medicare HMO | Admitting: Neurology

## 2017-10-02 VITALS — BP 156/99 | HR 67 | Ht 65.0 in | Wt 179.0 lb

## 2017-10-02 DIAGNOSIS — E663 Overweight: Secondary | ICD-10-CM

## 2017-10-02 DIAGNOSIS — R351 Nocturia: Secondary | ICD-10-CM

## 2017-10-02 DIAGNOSIS — G4719 Other hypersomnia: Secondary | ICD-10-CM | POA: Diagnosis not present

## 2017-10-02 DIAGNOSIS — I340 Nonrheumatic mitral (valve) insufficiency: Secondary | ICD-10-CM

## 2017-10-02 DIAGNOSIS — R0683 Snoring: Secondary | ICD-10-CM | POA: Diagnosis not present

## 2017-10-02 DIAGNOSIS — R011 Cardiac murmur, unspecified: Secondary | ICD-10-CM

## 2017-10-02 DIAGNOSIS — I499 Cardiac arrhythmia, unspecified: Secondary | ICD-10-CM | POA: Diagnosis not present

## 2017-10-02 NOTE — Patient Instructions (Signed)

## 2017-10-02 NOTE — Progress Notes (Signed)
Subjective:    Patient ID: Vash Quezada is a 67 y.o. male.  HPI     Huston Foley, MD, PhD [facility] [address], [address] [address] [place], [place] [address]  Dear Dr. Nicholos Johns,   I saw your patient, Calob Baskette, upon your kind request in my neurologic clinic today for initial consultation of his sleep disorder, in particular, concern for underlying obstructive sleep apnea. The patient is unaccompanied today. As you know, Mr. Boody is a 67 year old right-handed gentleman with an underlying medical history of hypertension, reflux disease, gout, left eye injury in the past, mitral regurgitation and mild diastolic dysfunction, vitamin D deficiency and overweight state, who reports snoring and excessive daytime somnolence as well and has difficulty maintaining sleep. I reviewed your office note from 06/20/2017, which you kindly included. His Epworth sleepiness score is 9 out of 24, fatigue score is 17 out of 63. He lives alone, is currently separated. He has 6 daughters. 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 He has a bedtime of 11 PM or midnight. He falls asleep fairly quickly but uses an over-the-counter sleep aid called Power sleep p.m. He takes 2 pills each night. Rise time is around 5:30 or 6. He has nocturia about once for average night, typically around 4 AM, he denies morning headaches. He was told in the past that he had a heart murmur. He has not been told that he has irregular heartbeat. He quit smoking over 15 years ago. He drinks alcohol occasionally, maybe once on the weekends, drinks caffeine in the form of coffee, less than one cup per day on average, more consistently in the wintertime.  His Past Medical History Is Significant For: Past Medical History:  Diagnosis Date  . Gout   . Hypertension   . Vitamin D deficiency     His Past Surgical History Is Significant For: Past Surgical History:  Procedure  Laterality Date  . EYE SURGERY  1993   due to injury    His Family History Is Significant For: Family History  Problem Relation Age of Onset  . Colon cancer Father     His Social History Is Significant For: Social History   Socioeconomic History  . Marital status: Legally Separated    Spouse name: Not on file  . Number of children: Not on file  . Years of education: Not on file  . Highest education level: Not on file  Occupational History  . Not on file  Social Needs  . Financial resource strain: Not on file  . Food insecurity:    Worry: Not on file    Inability: Not on file  . Transportation needs:    Medical: Not on file    Non-medical: Not on file  Tobacco Use  . Smoking status: Former Games developer  . Smokeless tobacco: Never Used  Substance and Sexual Activity  . Alcohol use: Yes    Comment: 2 times monthly "if that"  . Drug use: No  . Sexual activity: Not on file  Lifestyle  . Physical activity:    Days per week: Not on file    Minutes per session: Not on file  . Stress: Not on file  Relationships  . Social connections:    Talks on phone: Not on file    Gets together: Not on file    Attends religious service: Not on file    Active member of club or organization: Not on file  Attends meetings of clubs or organizations: Not on file    Relationship status: Not on file  Other Topics Concern  . Not on file  Social History Narrative  . Not on file    His Allergies Are:  No Known Allergies:   His Current Medications Are:  Outpatient Encounter Medications as of 10/02/2017  Medication Sig  . atorvastatin (LIPITOR) 40 MG tablet Take 40 mg by mouth daily.  Marland Kitchen. olmesartan-hydrochlorothiazide (BENICAR HCT) 40-12.5 MG per tablet Take 1 tablet by mouth daily.  . pantoprazole (PROTONIX) 40 MG tablet Take 40 mg by mouth daily.  Marland Kitchen. UNABLE TO FIND Med Name: Power Sleep  . [DISCONTINUED] Acetaminophen (TYLENOL ARTHRITIS PAIN PO) Take by mouth.  . [DISCONTINUED] diclofenac  sodium (VOLTAREN) 1 % GEL Apply the gel to the forehead and around the neck 4 times a day as needed for pain.  . [DISCONTINUED] naproxen (NAPROSYN) 375 MG tablet Take 1 tablet (375 mg total) by mouth 2 (two) times daily.  . [DISCONTINUED] PRESCRIPTION MEDICATION Vitamin d3 once monthly   No facility-administered encounter medications on file as of 10/02/2017.   :  Review of Systems:  Out of a complete 14 point review of systems, all are reviewed and negative with the exception of these symptoms as listed below: Review of Systems  Neurological:       Pt presents today to discuss his sleep. Pt has never had a sleep study but does endorse snoring.  Epworth Sleepiness Scale 0= would never doze 1= slight chance of dozing 2= moderate chance of dozing 3= high chance of dozing  Sitting and reading: 1 Watching TV: 2 Sitting inactive in a public place (ex. Theater or meeting): 2 As a passenger in a car for an hour without a break: 0 Lying down to rest in the afternoon: 1 Sitting and talking to someone: 0 Sitting quietly after lunch (no alcohol): 1 In a car, while stopped in traffic: 0 Total: 9     Objective:  Neurological Exam  Physical Exam Physical Examination:   Vitals:   10/02/17 1605  BP: (!) 156/99  Pulse: 67    General Examination: The patient is a very pleasant 67 y.o. male in no acute distress. He appears well-developed and well-nourished and well groomed.   HEENT: Normocephalic, atraumatic, Right pupil is round and reactive to light, left pupil is not reactive and irregular. This is not a new finding. Patient reports that he typically uses a contact lens on the left. He has evidence of cataract on the right. Hearing is grossly intact, face is symmetric, speech is clear, no dysarthria or hypophonia, he has no lip, neck or jaw tremor, airway examination reveals mild mouth dryness, edentulous on top, has his own teeth on the bottom, market airway crowding secondary to thicker  and wider tongue, thicker soft palate, smaller airway entry, tonsils not fully visualized, Mallampati class III. Neck circumference is 17.5 in.   Chest: Clear to auscultation without wheezing, rhonchi or crackles noted.  Heart: S1+S2+0, regularly irregular with 2/6 systolic murmur.    Abdomen: Soft, non-tender and non-distended with normal bowel sounds appreciated on auscultation.  Extremities: There is no pitting edema in the distal lower extremities bilaterally.   Skin: Warm and dry without trophic changes noted.  Musculoskeletal: exam reveals no obvious joint deformities, tenderness or joint swelling or erythema.   Neurologically:  Mental status: The patient is awake, alert and oriented in all 4 spheres. His immediate and remote memory, attention, language skills and  fund of knowledge are appropriate. There is no evidence of aphasia, agnosia, apraxia or anomia. Speech is clear with normal prosody and enunciation. Thought process is linear. Mood is normal and affect is normal.  Cranial nerves II - XII are as described above under HEENT exam. In addition: shoulder shrug is normal with equal shoulder height noted. Motor exam: Normal bulk, strength and tone is noted. There is no drift, tremor or rebound. Fine motor skills and coordination: grossly intact.   Cerebellar testing: No dysmetria or intention tremor. There is no truncal or gait ataxia.  Sensory exam: intact to light touch.   Gait, station and balance: He stands easily. No veering to one side is noted. No leaning to one side is noted. Posture is age-appropriate and stance is narrow based. Gait shows normal stride length and normal pace. No problems turning are noted. 0  Assessment and Plan:  In summary, Edgerrin Correia is a very pleasant 67 y.o.-year old male with an underlying medical history of hypertension, reflux disease, gout, left eye injury in the past, mitral regurgitation and mild diastolic dysfunction, vitamin D deficiency and  overweight state, whose history and physical exam are concerning for obstructive sleep apnea (OSA). I had a long chat with the patient about my findings and the diagnosis of OSA, its prognosis and treatment options. We talked about medical treatments, surgical interventions and non-pharmacological approaches. I explained in particular the risks and ramifications of untreated moderate to severe OSA, especially with respect to developing cardiovascular disease down the Road, including congestive heart failure, difficult to treat hypertension, cardiac arrhythmias, or stroke. Even type 2 diabetes has, in part, been linked to untreated OSA. Symptoms of untreated OSA include daytime sleepiness, memory problems, mood irritability and mood disorder such as depression and anxiety, lack of energy, as well as recurrent headaches, especially morning headaches. We talked about trying to maintain a healthy lifestyle in general, as well as the importance of weight control. I encouraged the patient to eat healthy, exercise daily and keep well hydrated, to keep a scheduled bedtime and wake time routine, to not skip any meals and eat healthy snacks in between meals. I advised the patient not to drive when feeling sleepy. I recommended the following at this time: sleep study with potential positive airway pressure titration. (We will score hypopneas at 4%).   I explained the sleep test procedure to the patient and also outlined possible surgical and non-surgical treatment options of OSA, including the use of a custom-made dental device (which would require a referral to a specialist dentist or oral surgeon), upper airway surgical options, such as pillar implants, radiofrequency surgery, tongue base surgery, and UPPP (which would involve a referral to an ENT surgeon). Rarely, jaw surgery such as mandibular advancement may be considered.  I also explained the CPAP treatment option to the patient, who indicated that he would be  willing to try CPAP if the need arises. I explained the importance of being compliant with PAP treatment, not only for insurance purposes but primarily to improve His symptoms, and for the patient's long term health benefit, including to reduce His cardiovascular risks. I answered all his questions today and the patient was in agreement. I plan to see him back after the sleep study is completed and encouraged him to call with any interim questions, concerns, problems or updates.   Thank you very much for allowing me to participate in the care of this nice patient. If I can be of any further assistance  to you please do not hesitate to call me at [phone].  Sincerely,   Star Age, MD, PhD

## 2017-10-12 ENCOUNTER — Telehealth: Payer: Self-pay

## 2017-10-12 DIAGNOSIS — G4719 Other hypersomnia: Secondary | ICD-10-CM

## 2017-10-12 NOTE — Telephone Encounter (Signed)
Insurance has denied in lab sleep study request but has aprroved for an HST. Will you send an order for a HST?

## 2017-10-12 NOTE — Addendum Note (Signed)
Addended by: Geronimo Running A on: 10/12/2017 09:33 AM   Modules accepted: Orders

## 2017-10-12 NOTE — Telephone Encounter (Signed)
VO for HST from Dr. Athar received. HST order placed.  

## 2017-10-24 ENCOUNTER — Ambulatory Visit (INDEPENDENT_AMBULATORY_CARE_PROVIDER_SITE_OTHER): Payer: Medicare HMO | Admitting: Neurology

## 2017-10-24 DIAGNOSIS — E663 Overweight: Secondary | ICD-10-CM

## 2017-10-24 DIAGNOSIS — I499 Cardiac arrhythmia, unspecified: Secondary | ICD-10-CM

## 2017-10-24 DIAGNOSIS — R351 Nocturia: Secondary | ICD-10-CM

## 2017-10-24 DIAGNOSIS — I340 Nonrheumatic mitral (valve) insufficiency: Secondary | ICD-10-CM

## 2017-10-24 DIAGNOSIS — G4720 Circadian rhythm sleep disorder, unspecified type: Secondary | ICD-10-CM

## 2017-10-24 DIAGNOSIS — G4719 Other hypersomnia: Secondary | ICD-10-CM

## 2017-10-24 DIAGNOSIS — R011 Cardiac murmur, unspecified: Secondary | ICD-10-CM

## 2017-10-24 DIAGNOSIS — G4733 Obstructive sleep apnea (adult) (pediatric): Secondary | ICD-10-CM

## 2017-10-24 DIAGNOSIS — R0683 Snoring: Secondary | ICD-10-CM

## 2017-10-27 NOTE — Addendum Note (Signed)
Addended by: Huston Foley on: 10/27/2017 12:22 PM   Modules accepted: Orders

## 2017-10-27 NOTE — Procedures (Signed)
PATIENT'S NAME:  Steven Brennan, Steven Brennan DOB:      Oct 20, 1950      MR#:    [id number]     DATE OF RECORDING: 10/24/2017 REFERRING M.D.:  Georgianne Fick MD Study Performed:   Baseline Polysomnogram HISTORY: 67 year old man with a history of hypertension, reflux disease, gout, left eye injury in the past, mitral regurgitation and mild diastolic dysfunction, vitamin D deficiency and overweight state, who reports snoring and excessive daytime somnolence as well and has difficulty maintaining sleep. The patient endorsed the Epworth Sleepiness Scale at 9 points. The patient's weight 179 pounds with a height of 65 (inches), resulting in a BMI of 29.8 kg/m2. The patient's neck circumference measured 17.5 inches.  CURRENT MEDICATIONS: Lipitor, Benicar, Protonix   PROCEDURE:  This is a multichannel digital polysomnogram utilizing the Somnostar 11.2 system.  Electrodes and sensors were applied and monitored per AASM Specifications.   EEG, EOG, Chin and Limb EMG, were sampled at 200 Hz.  ECG, Snore and Nasal Pressure, Thermal Airflow, Respiratory Effort, CPAP Flow and Pressure, Oximetry was sampled at 50 Hz. Digital video and audio were recorded.      BASELINE STUDY  Lights Out was at 21:37 and Lights On at 04:54.  Total recording time (TRT) was 437.5 minutes, with a total sleep time (TST) of 377 minutes.   The patient's sleep latency was 5 minutes.  REM latency was 75.5 minutes. The sleep efficiency was 86.2%.     SLEEP ARCHITECTURE: WASO (Wake after sleep onset) was 26.5 minutes with mild sleep fragmentation noted.  There were 20 minutes in Stage N1, 118.5 minutes Stage N2, 168.5 minutes Stage N3 and 70 minutes in Stage REM.  The percentage of Stage N1 was 5.3%, Stage N2 was 31.4%, Stage N3 was 44.7%, which is increased, and Stage R (REM sleep) was 18.6%, which is near normal. The arousals were noted as: 30 were spontaneous, 0 were associated with PLMs, 28 were associated with respiratory events.  RESPIRATORY  ANALYSIS:  There were a total of 73 respiratory events:  8 obstructive apneas, 10 central apneas and 31 mixed apneas with a total of 49 apneas and an apnea index (AI) of 7.8 /hour. There were 24 hypopneas with a hypopnea index of 3.8 /hour. The patient also had 0 respiratory event related arousals (RERAs).      The total APNEA/HYPOPNEA INDEX (AHI) was 11.6 /hour and the total RESPIRATORY DISTURBANCE INDEX was 11.6 /hour.  44 events occurred in REM sleep and 27 events in NREM. The REM AHI was 37.7 /hour, versus a non-REM AHI of 5.7. The patient spent 280 minutes of total sleep time in the supine position and 97 minutes in non-supine.. The supine AHI was 13.7 versus a non-supine AHI of 5.6.  OXYGEN SATURATION & C02:  The Wake baseline 02 saturation was 95%, with the lowest being 78%. Time spent below 89% saturation equaled 16 minutes.    PERIODIC LIMB MOVEMENTS: The patient had a total of 0 Periodic Limb Movements.  The Periodic Limb Movement (PLM) index was 0 and the PLM Arousal index was 0/hour.  Audio and video analysis did not show any abnormal or unusual movements, behaviors, phonations or vocalizations. The patient took 1 bathroom breaks. Mild to moderate snoring was noted. The EKG was in keeping with normal sinus rhythm (NSR).  Post-study, the patient indicated that sleep was the same as usual.   IMPRESSION:  1. Obstructive Sleep Apnea (OSA) 2. Dysfunctions associated with sleep stages or arousal from sleep  RECOMMENDATIONS:  1. This study demonstrates overall mild obstructive sleep apnea, severe in REM sleep with a total AHI of 11.6/hour, REM AHI of 37.7/hour, and O2 nadir of 78%. Given the patient's medical history and sleep related complaints, treatment with positive airway pressure is recommended; a full-night CPAP titration study is recommended to optimize therapy. Other treatment options may include avoidance of supine sleep position along with weight loss, upper airway or jaw surgery  in selected patients or the use of an oral appliance in certain patients. ENT evaluation and/or consultation with a maxillofacial surgeon or dentist may be feasible in some instances.    2. Please note that untreated obstructive sleep apnea may carry additional perioperative morbidity. Patients with significant obstructive sleep apnea (typically, in the moderate to severe degree) should receive, if possible, perioperative PAP (positive airway pressure) therapy and the surgeons and particularly the anesthesiologists should be informed of the diagnosis and the severity of the sleep disordered breathing. If feasible, the patient may be asked to bring his/her CPAP or BiPAP machine for a planned/elective surgery.  3. This study shows sleep fragmentation and abnormal sleep stage percentages; these are nonspecific findings and per se do not signify an intrinsic sleep disorder or a cause for the patient's sleep-related symptoms. Causes include (but are not limited to) the first night effect of the sleep study, circadian rhythm disturbances, medication effect or an underlying mood disorder or medical problem.  4. The patient should be cautioned not to drive, work at heights, or operate dangerous or heavy equipment when tired or sleepy. Review and reiteration of good sleep hygiene measures should be pursued with any patient. 5. The patient will be seen in follow-up in the sleep clinic at [facility] for discussion of the test results, symptom and treatment compliance review, further management strategies, etc. The referring provider will be notified of the test results.  I certify that I have reviewed the entire raw data recording prior to the issuance of this report in accordance with the Standards of Accreditation of the American Academy of Sleep Medicine (AASM)  Huston Foley, MD, PhD Diplomat, American Board of Neurology and Sleep Medicine (Neurology and Sleep Medicine)

## 2017-10-27 NOTE — Progress Notes (Signed)
Patient referred by Dr. Nicholos Johns, seen by me on 10/02/17, diagnostic PSG on 10/24/17.   Please call and notify the patient that the recent sleep study showed mild obstructive sleep apnea, severe in REM sleep with a total AHI of 11.6/hour, REM AHI of 37.7/hour, and O2 nadir of 78%. Given the patient's medical history and sleep related complaints, I recommend treatment for this in the form of CPAP. This will require a repeat sleep study for proper titration and mask fitting and correct monitoring of the oxygen saturations. Please explain to patient. I have placed an order in the chart. Thanks.  Huston Foley, MD, PhD [facility])

## 2017-10-30 ENCOUNTER — Telehealth: Payer: Self-pay

## 2017-10-30 NOTE — Telephone Encounter (Signed)
I called pt. I advised pt that Dr. Frances Furbish reviewed their sleep study results and found that mild osa, severe osa in REM sleep, and recommends that pt be treated with a cpap. Dr. Frances Furbish recommends that pt return for a repeat sleep study in order to properly titrate the cpap and ensure a good mask fit. Pt is agreeable to returning for a titration study. I advised pt that our sleep lab will file with pt's insurance and call pt to schedule the sleep study when we hear back from the pt's insurance regarding coverage of this sleep study. Pt verbalized understanding of results. Pt had no questions at this time but was encouraged to call back if questions arise.

## 2017-10-30 NOTE — Telephone Encounter (Signed)
-----   Message from Huston Foley, MD sent at 10/27/2017 12:22 PM EDT ----- Patient referred by Dr. Nicholos Johns, seen by me on 10/02/17, diagnostic PSG on 10/24/17.   Please call and notify the patient that the recent sleep study showed mild obstructive sleep apnea, severe in REM sleep with a total AHI of 11.6/hour, REM AHI of 37.7/hour, and O2 nadir of 78%. Given the patient's medical history and sleep related complaints, I recommend treatment for this in the form of CPAP. This will require a repeat sleep study for proper titration and mask fitting and correct monitoring of the oxygen saturations. Please explain to patient. I have placed an order in the chart. Thanks.  Huston Foley, MD, PhD [facility])

## 2017-11-21 DIAGNOSIS — E559 Vitamin D deficiency, unspecified: Secondary | ICD-10-CM | POA: Diagnosis not present

## 2017-11-21 DIAGNOSIS — I10 Essential (primary) hypertension: Secondary | ICD-10-CM | POA: Diagnosis not present

## 2017-11-21 DIAGNOSIS — M109 Gout, unspecified: Secondary | ICD-10-CM | POA: Diagnosis not present

## 2017-11-21 DIAGNOSIS — E782 Mixed hyperlipidemia: Secondary | ICD-10-CM | POA: Diagnosis not present

## 2017-11-21 DIAGNOSIS — Z79899 Other long term (current) drug therapy: Secondary | ICD-10-CM | POA: Diagnosis not present

## 2017-11-22 ENCOUNTER — Ambulatory Visit (INDEPENDENT_AMBULATORY_CARE_PROVIDER_SITE_OTHER): Payer: Medicare HMO | Admitting: Neurology

## 2017-11-22 DIAGNOSIS — I499 Cardiac arrhythmia, unspecified: Secondary | ICD-10-CM

## 2017-11-22 DIAGNOSIS — R011 Cardiac murmur, unspecified: Secondary | ICD-10-CM

## 2017-11-22 DIAGNOSIS — G4720 Circadian rhythm sleep disorder, unspecified type: Secondary | ICD-10-CM

## 2017-11-22 DIAGNOSIS — G4733 Obstructive sleep apnea (adult) (pediatric): Secondary | ICD-10-CM | POA: Diagnosis not present

## 2017-11-22 DIAGNOSIS — G4719 Other hypersomnia: Secondary | ICD-10-CM

## 2017-11-22 DIAGNOSIS — R351 Nocturia: Secondary | ICD-10-CM

## 2017-11-22 DIAGNOSIS — E663 Overweight: Secondary | ICD-10-CM

## 2017-11-22 DIAGNOSIS — I340 Nonrheumatic mitral (valve) insufficiency: Secondary | ICD-10-CM

## 2017-11-29 ENCOUNTER — Telehealth: Payer: Self-pay

## 2017-11-29 NOTE — Addendum Note (Signed)
Addended by: Huston FoleyATHAR, [place] on: 11/29/2017 07:53 AM   Modules accepted: Orders

## 2017-11-29 NOTE — Telephone Encounter (Signed)
I called pt. I advised pt that Dr. Frances FurbishAthar reviewed their sleep study results and found that pt did well during the latest sleep study with the cpap. Dr. Frances FurbishAthar recommends that pt start a cpap at home. I reviewed PAP compliance expectations with the pt. Pt is agreeable to starting a CPAP. I advised pt that an order will be sent to a DME, [facility], and [facility] will call the pt within about one week after they file with the pt's insurance. [facility] will show the pt how to use the machine, fit for masks, and troubleshoot the CPAP if needed. A follow up appt was made for insurance purposes with Eber Jonesarolyn, NP on 03/12/18 at 7:45am. Pt verbalized understanding to arrive 15 minutes early and bring their CPAP. A letter with all of this information in it will be mailed to the pt as a reminder. I verified with the pt that the address we have on file is correct. Pt verbalized understanding of results. Pt had no questions at this time but was encouraged to call back if questions arise. I have sent the order to [facility] and have received confirmation that they have received the order.

## 2017-11-29 NOTE — Progress Notes (Signed)
Patient referred by Dr. Nicholos Johnsamachandran, seen by me on 10/02/17, diagnostic PSG on 10/24/17. Patient had a CPAP titration study on 11/22/17.  Please call and inform patient that I have entered an order for treatment with positive airway pressure (PAP) treatment for obstructive sleep apnea (OSA). He did well during the latest sleep study with CPAP. We will, therefore, arrange for a machine for home use through a DME (durable medical equipment) company of His choice; and I will see the patient back in follow-up in about 10 weeks. Please also explain to the patient that I will be looking out for compliance data, which can be downloaded from the machine (stored on an SD card, that is inserted in the machine) or via remote access through a modem, that is built into the machine. At the time of the followup appointment we will discuss sleep study results and how it is going with PAP treatment at home. Please advise patient to bring His machine at the time of the first FU visit, even though this is cumbersome. Bringing the machine for every visit after that will likely not be needed, but often helps for the first visit to troubleshoot if needed. Please re-enforce the importance of compliance with treatment and the need for us to monitor compliance data - often an insurance requirement and actually good feedback for the patient as far as how they are doing.  Also remind patient, that any interim PAP machine or mask issues should be first addressed with the DME company, as they can often help better with technical and mask fit issues. Please ask if patient has a preference regarding DME company.  Please also make sure, the patient has a follow-up appointment with me in about 10 weeks from the setup date, thanks. May see one of our nurse practitioners if needed for proper timing of the FU appointment.  Please fax or rout report to the referring provider. Thanks,   Steven FoleySaima Markavious Micco, MD, PhD [facility])

## 2017-11-29 NOTE — Procedures (Signed)
S PATIENT'S NAME:  Cherie OuchRoach, Andrews DOB:      08/19/1950      MR#:    [id number]     DATE OF RECORDING: 11/22/2017 REFERRING M.D.:  Georgianne FickAjith Ramachandran MD Study Performed:   CPAP  Titration HISTORY: 67 year old right-handed gentleman with an underlying medical history of hypertension, reflux disease, gout, left eye injury in the past, mitral regurgitation and mild diastolic dysfunction, vitamin D deficiency and overweight state, who returns for a CPAP titration to treat his OSA. His baseline PSG on 10/24/2017 showed an AHI of 11.6 and low spo2 of 78%. The patient endorsed the Epworth Sleepiness Scale at 9 points,  BMI of 29.8 kg/m2.   CURRENT MEDICATIONS: Lipitor, Benicar, Protonix  PROCEDURE:  This is a multichannel digital polysomnogram utilizing the SomnoStar 11.2 system.  Electrodes and sensors were applied and monitored per AASM Specifications.   EEG, EOG, Chin and Limb EMG, were sampled at 200 Hz.  ECG, Snore and Nasal Pressure, Thermal Airflow, Respiratory Effort, CPAP Flow and Pressure, Oximetry was sampled at 50 Hz. Digital video and audio were recorded.      The patient was fitted with a MW nasal cradle interface from CPAP was initiated at 5 cmH20 with heated humidity per AASM split night standards and pressure was advanced to 12cmH20 because of hypopneas, apneas and desaturations.  At a PAP pressure of 12 cmH20, there was a reduction of the AHI to 0 with improvement of sleep apnea.  Lights Out was at 22:39 and Lights On at 05:02. Total recording time (TRT) was 383 minutes, with a total sleep time (TST) of 284 minutes. The patient's sleep latency was 9 minutes. REM latency was 43 minutes, which is mildly reduced. The sleep efficiency was 74.2%.    SLEEP ARCHITECTURE: WASO (Wake after sleep onset) was 90 minutes with mild to moderate sleep fragmentation noted. There were 28 minutes in Stage N1, 75 minutes Stage N2, 112 minutes Stage N3 and 69 minutes in Stage REM.  The percentage of Stage N1 was  9.9%, which is increased, Stage N2 was 26.4%, Stage N3 was 39.4%, which is increased, and Stage R (REM sleep) was 24.3%, which is normal. The arousals were noted as: 40 were spontaneous, 0 were associated with PLMs, 2 were associated with respiratory events.  RESPIRATORY ANALYSIS:  There was a total of 7 respiratory events: 0 obstructive apneas, 3 central apneas and 0 mixed apneas with a total of 3 apneas and an apnea index (AI) of .6 /hour. There were 4 hypopneas with a hypopnea index of .8/hour. The patient also had 0 respiratory event related arousals (RERAs).      The total APNEA/HYPOPNEA INDEX  (AHI) was 1.5 /hour and the total RESPIRATORY DISTURBANCE INDEX was 1.5 /hour  4 events occurred in REM sleep and 3 events in NREM. The REM AHI was 3.5 /hour versus a non-REM AHI of .8 /hour.  The patient spent 236.5 minutes of total sleep time in the supine position and 48 minutes in non-supine. The supine AHI was 0.6, versus a non-supine AHI of 6.3.  OXYGEN SATURATION & C02:  The baseline 02 saturation was 96%, with the lowest being 89%. Time spent below 89% saturation equaled 0 minutes.  PERIODIC LIMB MOVEMENTS:  The patient had a total of 0 Periodic Limb Movements. The Periodic Limb Movement (PLM) index was 0 and the PLM Arousal index was 0 /hour.  Audio and video analysis did not show any abnormal or unusual movements, behaviors, phonations or vocalizations. The  patient took 1 bathroom break. The EKG was in keeping with normal sinus rhythm (NSR).  Post-study, the patient indicated that sleep was the same as usual.   IMPRESSION:   1. Obstructive Sleep Apnea (OSA) 2. Dysfunctions associated with sleep stages or arousal from sleep   RECOMMENDATIONS:   1. This study demonstrates resolution of the patient's obstructive sleep apnea with CPAP therapy. I will, therefore, start the patient on home CPAP treatment at a pressure of 11 cm via medium-wide nasal cradle interface with heated humidity. The  patient should be reminded to be fully compliant with PAP therapy to improve sleep related symptoms and decrease long term cardiovascular risks. The patient should be reminded, that it may take up to 3 months to get fully used to using PAP with all planned sleep. The earlier full compliance is achieved, the better long term compliance tends to be. Please note that untreated obstructive sleep apnea may carry additional perioperative morbidity. Patients with significant obstructive sleep apnea should receive perioperative PAP therapy and the surgeons and particularly the anesthesiologist should be informed of the diagnosis and the severity of the sleep disordered breathing. 2. This study shows sleep fragmentation and abnormal sleep stage percentages; these are nonspecific findings and per se do not signify an intrinsic sleep disorder or a cause for the patient's sleep-related symptoms. Causes include (but are not limited to) the first night effect of the sleep study, circadian rhythm disturbances, medication effect or an underlying mood disorder or medical problem.  3. The patient should be cautioned not to drive, work at heights, or operate dangerous or heavy equipment when tired or sleepy. Review and reiteration of good sleep hygiene measures should be pursued with any patient. 4. The patient will be seen in follow-up in the sleep clinic at [facility] for discussion of the test results, symptom and treatment compliance review, further management strategies, etc. The referring provider will be notified of the test results.   I certify that I have reviewed the entire raw data recording prior to the issuance of this report in accordance with the Standards of Accreditation of the American Academy of Sleep Medicine (AASM)   Huston Foley, MD, PhD Diplomat, American Board of Neurology and Sleep Medicine (Neurology and Sleep Medicine)

## 2017-11-29 NOTE — Telephone Encounter (Signed)
-----   Message from Huston FoleySaima Athar, MD sent at 11/29/2017  7:53 AM EST ----- Patient referred by Dr. Nicholos Johnsamachandran, seen by me on 10/02/17, diagnostic PSG on 10/24/17. Patient had a CPAP titration study on 11/22/17.  Please call and inform patient that I have entered an order for treatment with positive airway pressure (PAP) treatment for obstructive sleep apnea (OSA). He did well during the latest sleep study with CPAP. We will, therefore, arrange for a machine for home use through a DME (durable medical equipment) company of His choice; and I will see the patient back in follow-up in about 10 weeks. Please also explain to the patient that I will be looking out for compliance data, which can be downloaded from the machine (stored on an SD card, that is inserted in the machine) or via remote access through a modem, that is built into the machine. At the time of the followup appointment we will discuss sleep study results and how it is going with PAP treatment at home. Please advise patient to bring His machine at the time of the first FU visit, even though this is cumbersome. Bringing the machine for every visit after that will likely not be needed, but often helps for the first visit to troubleshoot if needed. Please re-enforce the importance of compliance with treatment and the need for us to monitor compliance data - often an insurance requirement and actually good feedback for the patient as far as how they are doing.  Also remind patient, that any interim PAP machine or mask issues should be first addressed with the DME company, as they can often help better with technical and mask fit issues. Please ask if patient has a preference regarding DME company.  Please also make sure, the patient has a follow-up appointment with me in about 10 weeks from the setup date, thanks. May see one of our nurse practitioners if needed for proper timing of the FU appointment.  Please fax or rout report to the referring provider.  Thanks,   Huston FoleySaima Athar, MD, PhD [facility])

## 2017-12-29 DIAGNOSIS — G4733 Obstructive sleep apnea (adult) (pediatric): Secondary | ICD-10-CM | POA: Diagnosis not present

## 2018-01-26 DIAGNOSIS — E782 Mixed hyperlipidemia: Secondary | ICD-10-CM | POA: Diagnosis not present

## 2018-01-26 DIAGNOSIS — Z0000 Encounter for general adult medical examination without abnormal findings: Secondary | ICD-10-CM | POA: Diagnosis not present

## 2018-01-26 DIAGNOSIS — G4733 Obstructive sleep apnea (adult) (pediatric): Secondary | ICD-10-CM | POA: Diagnosis not present

## 2018-01-26 DIAGNOSIS — E559 Vitamin D deficiency, unspecified: Secondary | ICD-10-CM | POA: Diagnosis not present

## 2018-01-26 DIAGNOSIS — I10 Essential (primary) hypertension: Secondary | ICD-10-CM | POA: Diagnosis not present

## 2018-01-26 DIAGNOSIS — K219 Gastro-esophageal reflux disease without esophagitis: Secondary | ICD-10-CM | POA: Diagnosis not present

## 2018-01-26 DIAGNOSIS — M109 Gout, unspecified: Secondary | ICD-10-CM | POA: Diagnosis not present

## 2018-01-29 DIAGNOSIS — G4733 Obstructive sleep apnea (adult) (pediatric): Secondary | ICD-10-CM | POA: Diagnosis not present

## 2018-03-01 DIAGNOSIS — G4733 Obstructive sleep apnea (adult) (pediatric): Secondary | ICD-10-CM | POA: Diagnosis not present

## 2018-03-08 NOTE — Progress Notes (Addendum)
[facility]  PATIENT: Steven Brennan DOB: Mar 05, 1950   REASON FOR VISIT: Follow-up for obstructive sleep apnea with initial CPAP HISTORY FROM: Patient    HISTORY OF PRESENT ILLNESS:UPDATE 2/24/2020[name], 68 year old male returns for follow-up with newly diagnosed obstructive sleep apnea here for initial CPAP compliance.  He is still getting adjusted to his machine but he has good compliance.  Data dated 02/10/2018-03/11/2018 shows compliance greater than 4 hours at 93%.  Average usage 5 hours 29 minutes.  Set pressure 11 cm.  EPR level 1 AHI 0.7.  He returns for reevaluation   9/16/19SAMr. Brennan is a 68 year old right-handed gentleman with an underlying medical history of hypertension, reflux disease, gout, left eye injury in the past, mitral regurgitation and mild diastolic dysfunction, vitamin D deficiency and overweight state, who reports snoring and excessive daytime somnolence as well and has difficulty maintaining sleep. I reviewed your office note from 06/20/2017, which you kindly included. His Epworth sleepiness score is 9 out of 24, fatigue score is 17 out of 63. He lives alone, is currently separated. He has 6 daughters. 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 He has a bedtime of 11 PM or midnight. He falls asleep fairly quickly but uses an over-the-counter sleep aid called Power sleep p.m. He takes 2 pills each night. Rise time is around 5:30 or 6. He has nocturia about once for average night, typically around 4 AM, he denies morning headaches. He was told in the past that he had a heart murmur. He has not been told that he has irregular heartbeat. He quit smoking over 15 years ago. He drinks alcohol occasionally, maybe once on the weekends, drinks caffeine in the form of coffee, less than one cup per day on average, more consistently in the wintertime  REVIEW OF SYSTEMS: Full 14 system review of systems performed and notable  only for those listed, all others are neg:  Constitutional: neg  Cardiovascular: neg Ear/Nose/Throat: neg  Skin: neg Eyes: neg Respiratory: neg Gastroitestinal: neg  Hematology/Lymphatic: neg  Endocrine: neg Musculoskeletal:neg Allergy/Immunology: neg Neurological: neg Psychiatric: neg Sleep :  obstructive sleep apnea with initial CPAP   ALLERGIES: No Known Allergies  HOME MEDICATIONS: Outpatient Medications Prior to Visit  Medication Sig Dispense Refill  . atorvastatin (LIPITOR) 40 MG tablet Take 40 mg by mouth daily.    Marland Kitchen olmesartan-hydrochlorothiazide (BENICAR HCT) 40-12.5 MG per tablet Take 1 tablet by mouth daily.    . pantoprazole (PROTONIX) 40 MG tablet Take 40 mg by mouth daily.    Marland Kitchen UNABLE TO FIND Med Name: Power Sleep     No facility-administered medications prior to visit.     PAST MEDICAL HISTORY: Past Medical History:  Diagnosis Date  . Gout   . Hypertension   . Vitamin D deficiency     PAST SURGICAL HISTORY: Past Surgical History:  Procedure Laterality Date  . EYE SURGERY  1993   due to injury    FAMILY HISTORY: Family History  Problem Relation Age of Onset  . Colon cancer Father     SOCIAL HISTORY: Social History   Socioeconomic History  . Marital status: Legally Separated    Spouse name: Not on file  . Number of children: Not on file  . Years of education: Not on file  . Highest education level: Not on file  Occupational History  . Not on file  Social Needs  . Financial resource strain: Not on file  .  Food insecurity:    Worry: Not on file    Inability: Not on file  . Transportation needs:    Medical: Not on file    Non-medical: Not on file  Tobacco Use  . Smoking status: Former Games developer  . Smokeless tobacco: Never Used  Substance and Sexual Activity  . Alcohol use: Yes    Comment: 2 times monthly "if that"  . Drug use: No  . Sexual activity: Not on file  Lifestyle  . Physical activity:    Days per week: Not on file     Minutes per session: Not on file  . Stress: Not on file  Relationships  . Social connections:    Talks on phone: Not on file    Gets together: Not on file    Attends religious service: Not on file    Active member of club or organization: Not on file    Attends meetings of clubs or organizations: Not on file    Relationship status: Not on file  . Intimate partner violence:    Fear of current or ex partner: Not on file    Emotionally abused: Not on file    Physically abused: Not on file    Forced sexual activity: Not on file  Other Topics Concern  . Not on file  Social History Narrative  . Not on file     PHYSICAL EXAM  Vitals:   03/12/18 0727  BP: 133/80  Pulse: 69  Weight: 184 lb 9.6 oz (83.7 kg)  Height: 5\' 5"  (1.651 m)   Body mass index is 30.72 kg/m.  Generalized: Well developed, obese male inno acute distress  Head: normocephalic and atraumatic,. Oropharynx benign  Neck: Supple,  Musculoskeletal: No deformity   Neurological examination   Mentation: Alert oriented to time, place, history taking. Attention span and concentration appropriate. Recent and remote memory intact.  Follows all commands speech and language fluent.   Cranial nerve II-XII: Pupils were equal round reactive to light extraocular movements were full, visual field were full on confrontational test. Facial sensation and strength were normal. hearing was intact to finger rubbing bilaterally. Uvula tongue midline. head turning and shoulder shrug were normal and symmetric.Tongue protrusion into cheek strength was normal. Motor: normal bulk and tone, full strength in the BUE, BLE,  Sensory: normal and symmetric to light touch,  Coordination: finger-nose-finger, heel-to-shin bilaterally, no dysmetria Gait and Station: Rising up from seated position without assistance, normal stance,  moderate stride, good arm swing, smooth turning, able to perform tiptoe, and heel walking without difficulty. Tandem gait  is steady  DIAGNOSTIC DATA (LABS, IMAGING, TESTING) - ASSESSMENT AND PLAN  68 y.o. year old male  has a past medical history of Gout, Hypertension, and Vitamin D deficiency. here to follow-up for newly diagnosed obstructive sleep apnea here for initial CPAP.Data dated 02/10/2018-03/11/2018 shows compliance greater than 4 hours at 93%.  Average usage 5 hours 29 minutes.  Set pressure 11 cm.  EPR level 1 AHI 0.7.  CPAP compliance 93% reviewed data with patient Continue same settings Follow-up in 6 months then yearly Nilda Riggs, [facility], [facility], APRN  [facility] [address], [address] [place], [place] [address] [phone]  I reviewed the above note and documentation by the Nurse Practitioner and agree with the history, physical exam, assessment and plan as outlined above. I was immediately available for face-to-face consultation. Huston Foley, MD, PhD [facility])

## 2018-03-11 ENCOUNTER — Encounter: Payer: Self-pay | Admitting: Adult Health

## 2018-03-12 ENCOUNTER — Ambulatory Visit: Payer: Medicare HMO | Admitting: Nurse Practitioner

## 2018-03-12 ENCOUNTER — Encounter: Payer: Self-pay | Admitting: Nurse Practitioner

## 2018-03-12 DIAGNOSIS — G4733 Obstructive sleep apnea (adult) (pediatric): Secondary | ICD-10-CM | POA: Diagnosis not present

## 2018-03-12 DIAGNOSIS — Z9989 Dependence on other enabling machines and devices: Secondary | ICD-10-CM

## 2018-03-12 NOTE — Patient Instructions (Signed)
CPAP compliance 93% Continue same settings Follow-up in 6 months then yearly 

## 2018-03-30 DIAGNOSIS — G4733 Obstructive sleep apnea (adult) (pediatric): Secondary | ICD-10-CM | POA: Diagnosis not present

## 2018-04-05 ENCOUNTER — Encounter: Payer: Self-pay | Admitting: Gastroenterology

## 2018-04-30 DIAGNOSIS — G4733 Obstructive sleep apnea (adult) (pediatric): Secondary | ICD-10-CM | POA: Diagnosis not present

## 2018-05-01 ENCOUNTER — Telehealth: Payer: Self-pay | Admitting: Neurology

## 2018-05-01 NOTE — Telephone Encounter (Signed)
I called pt. He needs cpap supplies and he was due for some in January. I will reach out to [facility] and ask them to help with this. Pt verbalized understanding.

## 2018-05-01 NOTE — Telephone Encounter (Signed)
Pt called in and stated a script for his cpap needs to be sent to [facility]

## 2018-05-28 DIAGNOSIS — G4733 Obstructive sleep apnea (adult) (pediatric): Secondary | ICD-10-CM | POA: Diagnosis not present

## 2018-05-30 DIAGNOSIS — M1000 Idiopathic gout, unspecified site: Secondary | ICD-10-CM | POA: Diagnosis not present

## 2018-05-30 DIAGNOSIS — Z7189 Other specified counseling: Secondary | ICD-10-CM | POA: Diagnosis not present

## 2018-05-30 DIAGNOSIS — G4733 Obstructive sleep apnea (adult) (pediatric): Secondary | ICD-10-CM | POA: Diagnosis not present

## 2018-06-30 DIAGNOSIS — G4733 Obstructive sleep apnea (adult) (pediatric): Secondary | ICD-10-CM | POA: Diagnosis not present

## 2018-07-30 DIAGNOSIS — G4733 Obstructive sleep apnea (adult) (pediatric): Secondary | ICD-10-CM | POA: Diagnosis not present

## 2018-08-10 DIAGNOSIS — M109 Gout, unspecified: Secondary | ICD-10-CM | POA: Diagnosis not present

## 2018-08-10 DIAGNOSIS — E782 Mixed hyperlipidemia: Secondary | ICD-10-CM | POA: Diagnosis not present

## 2018-08-10 DIAGNOSIS — I10 Essential (primary) hypertension: Secondary | ICD-10-CM | POA: Diagnosis not present

## 2018-08-17 DIAGNOSIS — M109 Gout, unspecified: Secondary | ICD-10-CM | POA: Diagnosis not present

## 2018-08-17 DIAGNOSIS — E782 Mixed hyperlipidemia: Secondary | ICD-10-CM | POA: Diagnosis not present

## 2018-08-17 DIAGNOSIS — I10 Essential (primary) hypertension: Secondary | ICD-10-CM | POA: Diagnosis not present

## 2018-08-17 DIAGNOSIS — Z7189 Other specified counseling: Secondary | ICD-10-CM | POA: Diagnosis not present

## 2018-08-30 DIAGNOSIS — G4733 Obstructive sleep apnea (adult) (pediatric): Secondary | ICD-10-CM | POA: Diagnosis not present

## 2018-09-10 ENCOUNTER — Ambulatory Visit: Payer: Medicare HMO | Admitting: Adult Health

## 2018-09-12 DIAGNOSIS — H40029 Open angle with borderline findings, high risk, unspecified eye: Secondary | ICD-10-CM | POA: Diagnosis not present

## 2018-09-17 DIAGNOSIS — H20022 Recurrent acute iridocyclitis, left eye: Secondary | ICD-10-CM | POA: Diagnosis not present

## 2018-09-27 DIAGNOSIS — G4733 Obstructive sleep apnea (adult) (pediatric): Secondary | ICD-10-CM | POA: Diagnosis not present

## 2018-09-30 DIAGNOSIS — G4733 Obstructive sleep apnea (adult) (pediatric): Secondary | ICD-10-CM | POA: Diagnosis not present

## 2018-10-01 DIAGNOSIS — H209 Unspecified iridocyclitis: Secondary | ICD-10-CM | POA: Diagnosis not present

## 2018-10-08 DIAGNOSIS — H209 Unspecified iridocyclitis: Secondary | ICD-10-CM | POA: Diagnosis not present

## 2018-10-22 ENCOUNTER — Ambulatory Visit: Payer: Medicare HMO | Admitting: Adult Health

## 2018-10-22 ENCOUNTER — Ambulatory Visit: Payer: Medicare HMO | Admitting: Family Medicine

## 2018-10-22 DIAGNOSIS — H15002 Unspecified scleritis, left eye: Secondary | ICD-10-CM | POA: Diagnosis not present

## 2018-10-24 NOTE — Progress Notes (Deleted)
PATIENT: Steven OuchLarry Brennan DOB: [birth year]  REASON FOR VISIT: follow up HISTORY FROM: patient  No chief complaint on file.    HISTORY OF PRESENT ILLNESS: Today 10/24/18 Steven Brennan is a 68 y.o. male here today for follow up.   HISTORY: (copied from [place] [employer] Brennan's note on 03/12/2018)  UPDATE 2/24/2020[name], 68 year old male returns for follow-up with newly diagnosed obstructive sleep apnea here for initial CPAP compliance.  He is still getting adjusted to his machine but he has good compliance.  Data dated 02/10/2018-03/11/2018 shows compliance greater than 4 hours at 93%.  Average usage 5 hours 29 minutes.  Set pressure 11 cm.  EPR level 1 AHI 0.7.  He returns for reevaluation   9/16/19SAMr. Steven Brennan is a 68 year old right-handed gentleman with an underlying medical history of hypertension, reflux disease, gout, left eye injury in the past, mitral regurgitation and mild diastolic dysfunction, vitamin D deficiency and overweight state, who reports snoring and excessive daytime somnolence as well and has difficulty maintaining sleep. I reviewed your office note from 06/20/2017, which you kindly included. His Epworth sleepiness score is 9 out of 24, fatigue score is 17 out of 63. He lives alone, is currently separated. He has 6 daughters. 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 He has a bedtime of 11 PM or midnight. He falls asleep fairly quickly but uses an over-the-counter sleep aid called Power sleep p.m. He takes 2 pills each night. Rise time is around 5:30 or 6. He has nocturia about once for average night, typically around 4 AM, he denies morning headaches. He was told in the past that he had a heart murmur. He has not been told that he has irregular heartbeat. He quit smoking over 15 years ago. He drinks alcohol occasionally, maybe once on the weekends, drinks caffeine in the form of coffee, less than one cup per day on average, more consistently in  the wintertime   REVIEW OF SYSTEMS: Out of a complete 14 system review of symptoms, the patient complains only of the following symptoms, and all other reviewed systems are negative.  ALLERGIES: No Known Allergies  HOME MEDICATIONS: Outpatient Medications Prior to Visit  Medication Sig Dispense Refill   atorvastatin (LIPITOR) 40 MG tablet Take 40 mg by mouth daily.     olmesartan-hydrochlorothiazide (BENICAR HCT) 40-12.5 MG per tablet Take 1 tablet by mouth daily.     pantoprazole (PROTONIX) 40 MG tablet Take 40 mg by mouth daily.     UNABLE TO FIND Med Name: Power Sleep     No facility-administered medications prior to visit.     PAST MEDICAL HISTORY: Past Medical History:  Diagnosis Date   Gout    Hypertension    Vitamin D deficiency     PAST SURGICAL HISTORY: Past Surgical History:  Procedure Laterality Date   EYE SURGERY  1993   due to injury    FAMILY HISTORY: Family History  Problem Relation Age of Onset   Colon cancer Father     SOCIAL HISTORY: Social History   Socioeconomic History   Marital status: Legally Separated    Spouse name: Not on file   Number of children: Not on file   Years of education: Not on file   Highest education level: Not on file  Occupational History   Not on file  Social Needs   Financial resource strain: Not on file   Food insecurity    Worry: Not on file  Inability: Not on file   Transportation needs    Medical: Not on file    Non-medical: Not on file  Tobacco Use   Smoking status: Former Smoker   Smokeless tobacco: Never Used  Substance and Sexual Activity   Alcohol use: Yes    Comment: 2 times monthly "if that"   Drug use: No   Sexual activity: Not on file  Lifestyle   Physical activity    Days per week: Not on file    Minutes per session: Not on file   Stress: Not on file  Relationships   Social connections    Talks on phone: Not on file    Gets together: Not on file    Attends  religious service: Not on file    Active member of club or organization: Not on file    Attends meetings of clubs or organizations: Not on file    Relationship status: Not on file   Intimate partner violence    Fear of current or ex partner: Not on file    Emotionally abused: Not on file    Physically abused: Not on file    Forced sexual activity: Not on file  Other Topics Concern   Not on file  Social History Narrative   Not on file      PHYSICAL EXAM  There were no vitals filed for this visit. There is no height or weight on file to calculate BMI.  Generalized: Well developed, in no acute distress  Cardiology: normal rate and rhythm, no murmur noted Neurological examination  Mentation: Alert oriented to time, place, history taking. Follows all commands speech and language fluent Cranial nerve II-XII: Pupils were equal round reactive to light. Extraocular movements were full, visual field were full on confrontational test. Facial sensation and strength were normal. Uvula tongue midline. Head turning and shoulder shrug  were normal and symmetric. Motor: The motor testing reveals 5 over 5 strength of all 4 extremities. Good symmetric motor tone is noted throughout.  Sensory: Sensory testing is intact to soft touch on all 4 extremities. No evidence of extinction is noted.  Coordination: Cerebellar testing reveals good finger-nose-finger and heel-to-shin bilaterally.  Gait and station: Gait is normal. Tandem gait is normal. Romberg is negative. No drift is seen.  Reflexes: Deep tendon reflexes are symmetric and normal bilaterally.   DIAGNOSTIC DATA (LABS, IMAGING, TESTING) - I reviewed patient records, labs, notes, testing and imaging myself where available.  No flowsheet data found.   No results found for: WBC, HGB, HCT, MCV, PLT No results found for: NA, K, CL, CO2, GLUCOSE, BUN, CREATININE, CALCIUM, PROT, ALBUMIN, AST, ALT, ALKPHOS, BILITOT, GFRNONAA, GFRAA No results found  for: CHOL, HDL, LDLCALC, LDLDIRECT, TRIG, CHOLHDL No results found for: HGBA1C No results found for: VITAMINB12 No results found for: TSH     ASSESSMENT AND PLAN 68 y.o. year old male  has a past medical history of Gout, Hypertension, and Vitamin D deficiency. here with ***    ICD-10-CM   1. Obstructive sleep apnea treated with continuous positive airway pressure (CPAP)  G47.33    Z99.89        No orders of the defined types were placed in this encounter.    No orders of the defined types were placed in this encounter.     I spent 15 minutes with the patient. 50% of this time was spent counseling and educating patient on plan of care and medications.    Baylei Siebels, FNP-C  10/24/2018, 4:30 PM [facility] [address] [place], [place] [address] [phone]

## 2018-10-25 ENCOUNTER — Other Ambulatory Visit: Payer: Self-pay

## 2018-10-25 ENCOUNTER — Ambulatory Visit: Payer: Medicare HMO | Admitting: Family Medicine

## 2018-10-25 ENCOUNTER — Encounter: Payer: Self-pay | Admitting: Family Medicine

## 2018-10-30 DIAGNOSIS — H30033 Focal chorioretinal inflammation, peripheral, bilateral: Secondary | ICD-10-CM | POA: Diagnosis not present

## 2018-10-30 DIAGNOSIS — H3581 Retinal edema: Secondary | ICD-10-CM | POA: Diagnosis not present

## 2018-10-30 DIAGNOSIS — H2702 Aphakia, left eye: Secondary | ICD-10-CM | POA: Diagnosis not present

## 2018-10-31 DIAGNOSIS — H30033 Focal chorioretinal inflammation, peripheral, bilateral: Secondary | ICD-10-CM | POA: Diagnosis not present

## 2018-11-06 ENCOUNTER — Telehealth: Payer: Self-pay | Admitting: Family Medicine

## 2018-11-06 NOTE — Telephone Encounter (Signed)
Patient was scheduled with Amy 10/25/2018 he had to reschedule that apt because he did not have his co pay .

## 2018-11-08 DIAGNOSIS — H30033 Focal chorioretinal inflammation, peripheral, bilateral: Secondary | ICD-10-CM | POA: Diagnosis not present

## 2018-11-13 DIAGNOSIS — H3581 Retinal edema: Secondary | ICD-10-CM | POA: Diagnosis not present

## 2018-11-13 DIAGNOSIS — H2702 Aphakia, left eye: Secondary | ICD-10-CM | POA: Diagnosis not present

## 2018-11-13 DIAGNOSIS — H30033 Focal chorioretinal inflammation, peripheral, bilateral: Secondary | ICD-10-CM | POA: Diagnosis not present

## 2018-11-19 ENCOUNTER — Ambulatory Visit: Payer: Medicare HMO | Admitting: Family Medicine

## 2018-11-19 ENCOUNTER — Other Ambulatory Visit: Payer: Self-pay

## 2018-11-19 ENCOUNTER — Encounter: Payer: Self-pay | Admitting: Family Medicine

## 2018-11-19 VITALS — BP 119/81 | HR 88 | Temp 98.0°F | Ht 65.0 in | Wt 177.6 lb

## 2018-11-19 DIAGNOSIS — Z9989 Dependence on other enabling machines and devices: Secondary | ICD-10-CM

## 2018-11-19 DIAGNOSIS — G4733 Obstructive sleep apnea (adult) (pediatric): Secondary | ICD-10-CM

## 2018-11-19 NOTE — Progress Notes (Addendum)
PATIENT: Steven Brennan DOB: 1950-07-21  REASON FOR VISIT: follow up HISTORY FROM: patient  Chief Complaint  Patient presents with  . Follow-up    Room 1, alone. Cannot sleep with the CPAP. States it makes his mouth dry and he cannot swallow.     HISTORY OF PRESENT ILLNESS: Today 11/19/18 Steven Brennan is a 68 y.o. male here today for follow up of OSA on CPAP.  He reports that over the last few months he has had difficulty using CPAP.  He has had an infection in his eye and feels that CPAP therapy was intolerable.  He has been treated by ophthalmology and infection has resolved.  He has resumed CPAP therapy but states that now he is having difficulty with dry mouth.  He has adjusted his humidity settings.  He feels that he can continue to work with humidity settings at home.  Compliance report dated 10/16/2018 through 11/14/2018 reveals that he has used CPAP 20 out of the last 30 days for compliance of 67%.  He used CPAP greater than 4 hours 15 of the last 30 days for compliance of 50%.  Average usage was 4 hours and 25 minutes.  AHI was 0.6 on 11 cm of water and an EPR of 1.  There was no significant leak noted.  HISTORY: (copied from [place] [employer]'s note on 03/12/2018)  UPDATE 2/24/2020[name], 68 year old male returns for follow-up with newly diagnosed obstructive sleep apnea here for initial CPAP compliance.  He is still getting adjusted to his machine but he has good compliance.  Data dated 02/10/2018-03/11/2018 shows compliance greater than 4 hours at 93%.  Average usage 5 hours 29 minutes.  Set pressure 11 cm.  EPR level 1 AHI 0.7.  He returns for reevaluation  9/16/19[name] is a 68 year old right-handed gentleman with an underlying medical history of hypertension, reflux disease, gout, left eye injury in the past, mitral regurgitation and mild diastolic dysfunction, vitamin D deficiency and overweight state, who reports snoring and excessive daytime somnolence as well and has  difficulty maintaining sleep. I reviewed your office note from 06/20/2017, which you kindly included. His Epworth sleepiness score is 9 out of 24, fatigue score is 17 out of 63. He lives alone, is currently separated. He has 6 daughters. 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 He has a bedtime of 11 PM or midnight. He falls asleep fairly quickly but uses an over-the-counter sleep aid called Power sleep p.m. He takes 2 pills each night. Rise time is around 5:30 or 6. He has nocturia about once for average night, typically around 4 AM, he denies morning headaches. He was told in the past that he had a heart murmur. He has not been told that he has irregular heartbeat. He quit smoking over 15 years ago. He drinks alcohol occasionally, maybe once on the weekends, drinks caffeine in the form of coffee, less than one cup per day on average, more consistently in the wintertime   REVIEW OF SYSTEMS: Out of a complete 14 system review of symptoms, the patient complains only of the following symptoms, numbness and all other reviewed systems are negative.  Epworth sleepiness scale: 3  ALLERGIES: No Known Allergies  HOME MEDICATIONS: Outpatient Medications Prior to Visit  Medication Sig Dispense Refill  . atorvastatin (LIPITOR) 40 MG tablet Take 40 mg by mouth daily.    . folic acid (FOLVITE) 1 MG tablet Take 1 mg by mouth  daily.    . methotrexate (RHEUMATREX) 2.5 MG tablet Take 10 mg by mouth once a week.    . olmesartan-hydrochlorothiazide (BENICAR HCT) 40-12.5 MG per tablet Take 1 tablet by mouth daily.    . pantoprazole (PROTONIX) 40 MG tablet Take 40 mg by mouth daily.    . prednisoLONE acetate (PRED FORTE) 1 % ophthalmic suspension Place 1 drop into the left eye 3 times daily.    Marland Kitchen UNABLE TO FIND Med Name: Power Sleep     No facility-administered medications prior to visit.     PAST MEDICAL HISTORY: Past Medical History:  Diagnosis Date  . Gout   .  Hypertension   . Vitamin D deficiency     PAST SURGICAL HISTORY: Past Surgical History:  Procedure Laterality Date  . EYE SURGERY  1993   due to injury    FAMILY HISTORY: Family History  Problem Relation Age of Onset  . Colon cancer Father     SOCIAL HISTORY: Social History   Socioeconomic History  . Marital status: Legally Separated    Spouse name: Not on file  . Number of children: Not on file  . Years of education: Not on file  . Highest education level: Not on file  Occupational History  . Not on file  Social Needs  . Financial resource strain: Not on file  . Food insecurity    Worry: Not on file    Inability: Not on file  . Transportation needs    Medical: Not on file    Non-medical: Not on file  Tobacco Use  . Smoking status: Former Games developer  . Smokeless tobacco: Never Used  Substance and Sexual Activity  . Alcohol use: Yes    Comment: 2 times monthly "if that"  . Drug use: No  . Sexual activity: Not on file  Lifestyle  . Physical activity    Days per week: Not on file    Minutes per session: Not on file  . Stress: Not on file  Relationships  . Social Musician on phone: Not on file    Gets together: Not on file    Attends religious service: Not on file    Active member of club or organization: Not on file    Attends meetings of clubs or organizations: Not on file    Relationship status: Not on file  . Intimate partner violence    Fear of current or ex partner: Not on file    Emotionally abused: Not on file    Physically abused: Not on file    Forced sexual activity: Not on file  Other Topics Concern  . Not on file  Social History Narrative  . Not on file      PHYSICAL EXAM  Vitals:   11/19/18 0819  BP: 119/81  Pulse: 88  Temp: 98 F (36.7 C)  Weight: 177 lb 9.6 oz (80.6 kg)  Height: 5\' 5"  (1.651 m)   Body mass index is 29.55 kg/m.  Generalized: Well developed, in no acute distress  Cardiology: normal rate and rhythm,  no murmur noted Respiratory: Clear to auscultation bilaterally Neurological examination  Mentation: Alert oriented to time, place, history taking. Follows all commands speech and language fluent Cranial nerve II-XII: Pupils were equal round reactive to light. Extraocular movements were full, visual field were full on confrontational test.  Motor: The motor testing reveals 5 over 5 strength of all 4 extremities. Good symmetric motor tone is noted throughout.  Gait  and station: Gait is normal.    DIAGNOSTIC DATA (LABS, IMAGING, TESTING) - I reviewed patient records, labs, notes, testing and imaging myself where available.  No flowsheet data found.   No results found for: WBC, HGB, HCT, MCV, PLT No results found for: NA, K, CL, CO2, GLUCOSE, BUN, CREATININE, CALCIUM, PROT, ALBUMIN, AST, ALT, ALKPHOS, BILITOT, GFRNONAA, GFRAA No results found for: CHOL, HDL, LDLCALC, LDLDIRECT, TRIG, CHOLHDL No results found for: HGBA1C No results found for: VITAMINB12 No results found for: TSH     ASSESSMENT AND PLAN 68 y.o. year old male  has a past medical history of Gout, Hypertension, and Vitamin D deficiency. here with     ICD-10-CM   1. Obstructive sleep apnea treated with continuous positive airway pressure (CPAP)  G47.33    Z99.89      Steven Brennan continues to work towards a goal of using CPAP nightly.  He has had some complications recently with an eye infection that has interrupted CPAP therapy.  We have discussed ways to help dry mouth with increased humidity as well as Biotene mouthwash or gel over-the-counter.  We have discussed risk of untreated sleep apnea.  He was encouraged to continue using CPAP nightly and for greater than 4 hours each night.  He will follow-up with me in 6 months, sooner if needed.  He verbalizes understanding and agreement with this plan.  No orders of the defined types were placed in this encounter.    No orders of the defined types were placed in this encounter.      I spent 15 minutes with the patient. 50% of this time was spent counseling and educating patient on plan of care and medications.    Debbora Presto, FNP-C 11/19/2018, 9:40 AM [facility] [address] [place], [place] [address] [phone]  I reviewed the above note and documentation by the Nurse Practitioner and agree with the history, exam, assessment and plan as outlined above. I was available for consultation. Star Age, MD, PhD [facility])

## 2018-11-19 NOTE — Patient Instructions (Addendum)
Please work toward goal of nightly CPAP use and for at least 4 hours each night  Please adjust humidity settings to help with dry mouth  Follow up in 3 months, sooner if needed  Sleep Apnea Sleep apnea affects breathing during sleep. It causes breathing to stop for a short time or to become shallow. It can also increase the risk of:  Heart attack.  Stroke.  Being very overweight (obese).  Diabetes.  Heart failure.  Irregular heartbeat. The goal of treatment is to help you breathe normally again. What are the causes? There are three kinds of sleep apnea:  Obstructive sleep apnea. This is caused by a blocked or collapsed airway.  Central sleep apnea. This happens when the brain does not send the right signals to the muscles that control breathing.  Mixed sleep apnea. This is a combination of obstructive and central sleep apnea. The most common cause of this condition is a collapsed or blocked airway. This can happen if:  Your throat muscles are too relaxed.  Your tongue and tonsils are too large.  You are overweight.  Your airway is too small. What increases the risk?  Being overweight.  Smoking.  Having a small airway.  Being older.  Being male.  Drinking alcohol.  Taking medicines to calm yourself (sedatives or tranquilizers).  Having family members with the condition. What are the signs or symptoms?  Trouble staying asleep.  Being sleepy or tired during the day.  Getting angry a lot.  Loud snoring.  Headaches in the morning.  Not being able to focus your mind (concentrate).  Forgetting things.  Less interest in sex.  Mood swings.  Personality changes.  Feelings of sadness (depression).  Waking up a lot during the night to pee (urinate).  Dry mouth.  Sore throat. How is this diagnosed?  Your medical history.  A physical exam.  A test that is done when you are sleeping (sleep study). The test is most often done in a sleep lab  but may also be done at home. How is this treated?   Sleeping on your side.  Using a medicine to get rid of mucus in your nose (decongestant).  Avoiding the use of alcohol, medicines to help you relax, or certain pain medicines (narcotics).  Losing weight, if needed.  Changing your diet.  Not smoking.  Using a machine to open your airway while you sleep, such as: ? An oral appliance. This is a mouthpiece that shifts your lower jaw forward. ? A CPAP device. This device blows air through a mask when you breathe out (exhale). ? An EPAP device. This has valves that you put in each nostril. ? A BPAP device. This device blows air through a mask when you breathe in (inhale) and breathe out.  Having surgery if other treatments do not work. It is important to get treatment for sleep apnea. Without treatment, it can lead to:  High blood pressure.  Coronary artery disease.  In men, not being able to have an erection (impotence).  Reduced thinking ability. Follow these instructions at home: Lifestyle  Make changes that your doctor recommends.  Eat a healthy diet.  Lose weight if needed.  Avoid alcohol, medicines to help you relax, and some pain medicines.  Do not use any products that contain nicotine or tobacco, such as cigarettes, e-cigarettes, and chewing tobacco. If you need help quitting, ask your doctor. General instructions  Take over-the-counter and prescription medicines only as told by your doctor.  If you were given a machine to use while you sleep, use it only as told by your doctor.  If you are having surgery, make sure to tell your doctor you have sleep apnea. You may need to bring your device with you.  Keep all follow-up visits as told by your doctor. This is important. Contact a doctor if:  The machine that you were given to use during sleep bothers you or does not seem to be working.  You do not get better.  You get worse. Get help right away if:   Your chest hurts.  You have trouble breathing in enough air.  You have an uncomfortable feeling in your back, arms, or stomach.  You have trouble talking.  One side of your body feels weak.  A part of your face is hanging down. These symptoms may be an emergency. Do not wait to see if the symptoms will go away. Get medical help right away. Call your local emergency services (911 in the [place] Do not drive yourself to the hospital. Summary  This condition affects breathing during sleep.  The most common cause is a collapsed or blocked airway.  The goal of treatment is to help you breathe normally while you sleep. This information is not intended to replace advice given to you by your health care provider. Make sure you discuss any questions you have with your health care provider. Document Released: 10/13/2007 Document Revised: 10/20/2017 Document Reviewed: 08/29/2017 Elsevier Patient Education  2020 [employer].

## 2019-01-04 DIAGNOSIS — H2702 Aphakia, left eye: Secondary | ICD-10-CM | POA: Diagnosis not present

## 2019-01-04 DIAGNOSIS — H30033 Focal chorioretinal inflammation, peripheral, bilateral: Secondary | ICD-10-CM | POA: Diagnosis not present

## 2019-01-04 DIAGNOSIS — H3581 Retinal edema: Secondary | ICD-10-CM | POA: Diagnosis not present

## 2019-01-04 DIAGNOSIS — Z79899 Other long term (current) drug therapy: Secondary | ICD-10-CM | POA: Diagnosis not present

## 2019-01-09 DIAGNOSIS — Z0000 Encounter for general adult medical examination without abnormal findings: Secondary | ICD-10-CM | POA: Diagnosis not present

## 2019-01-09 DIAGNOSIS — I10 Essential (primary) hypertension: Secondary | ICD-10-CM | POA: Diagnosis not present

## 2019-01-09 DIAGNOSIS — G4700 Insomnia, unspecified: Secondary | ICD-10-CM | POA: Diagnosis not present

## 2019-01-09 DIAGNOSIS — R5383 Other fatigue: Secondary | ICD-10-CM | POA: Diagnosis not present

## 2019-01-16 DIAGNOSIS — R55 Syncope and collapse: Secondary | ICD-10-CM | POA: Diagnosis not present

## 2019-01-16 DIAGNOSIS — W19XXXA Unspecified fall, initial encounter: Secondary | ICD-10-CM | POA: Diagnosis not present

## 2019-01-16 DIAGNOSIS — R0902 Hypoxemia: Secondary | ICD-10-CM | POA: Diagnosis not present

## 2019-01-17 DIAGNOSIS — R55 Syncope and collapse: Secondary | ICD-10-CM | POA: Diagnosis not present

## 2019-01-18 HISTORY — PX: INTRAOCULAR LENS INSERTION: SHX110

## 2019-01-23 DIAGNOSIS — G4733 Obstructive sleep apnea (adult) (pediatric): Secondary | ICD-10-CM | POA: Diagnosis not present

## 2019-01-23 DIAGNOSIS — G4700 Insomnia, unspecified: Secondary | ICD-10-CM | POA: Diagnosis not present

## 2019-01-23 DIAGNOSIS — R630 Anorexia: Secondary | ICD-10-CM | POA: Diagnosis not present

## 2019-01-23 DIAGNOSIS — R55 Syncope and collapse: Secondary | ICD-10-CM | POA: Diagnosis not present

## 2019-02-06 DIAGNOSIS — G4733 Obstructive sleep apnea (adult) (pediatric): Secondary | ICD-10-CM | POA: Diagnosis not present

## 2019-02-07 DIAGNOSIS — R55 Syncope and collapse: Secondary | ICD-10-CM | POA: Insufficient documentation

## 2019-02-07 NOTE — Progress Notes (Signed)
Patient referred by Tammi Klippel* for syncope  Subjective:   Steven Brennan, male    DOB: 1950-04-08, 69 y.o.   MRN: [id number]   Chief Complaint  Patient presents with  . Loss of Consciousness    HPI  69 y.o. African Ameri male with hypertension, former smoker, with syncope.  Patient is a Administrator. Three weeks ago, he was standing inside the [employer] in line. He admits that he had not had anything to eat or drink that morning, and had taking a sleeping pill, he thinks it was trazodone, the night before. He had sudden onset of flushing sensation, followed by sudden loss of consciousness. He did not have any preceding chest pain, shortness of breath, lightheadedness symptoms. He does not know how long he lost consciousness for. When he regained consciousness, he was surrounded by people who called EMS. EMS arrived, checked his BP, which was reportedly SBP 118 mmHg. He was not taken to the hospital. He has not had any recurrence of syncope since then.  At baseline, he is a former smoker, is on antihypertensive medications. He walks about a mile 2-3 days a week.  He endorses "chest congestion" when he is walking, that improves after he clears his throat.    Past Medical History:  Diagnosis Date  . Gout   . Hypertension   . Vitamin D deficiency      Past Surgical History:  Procedure Laterality Date  . EYE SURGERY  1993   due to injury     Social History   Tobacco Use  Smoking Status Former Smoker  Smokeless Tobacco Never Used    Social History   Substance and Sexual Activity  Alcohol Use Yes   Comment: 2 times monthly "if that"     Family History  Problem Relation Age of Onset  . Colon cancer Father      Current Outpatient Medications on File Prior to Visit  Medication Sig Dispense Refill  . allopurinol (ZYLOPRIM) 100 MG tablet Take 100 mg by mouth daily.    Marland Kitchen atorvastatin (LIPITOR) 40 MG tablet Take 40 mg by mouth daily.    . folic acid (FOLVITE) 1  MG tablet Take 1 mg by mouth daily.    . methotrexate (RHEUMATREX) 2.5 MG tablet Take 10 mg by mouth once a week.    . pantoprazole (PROTONIX) 40 MG tablet Take 40 mg by mouth daily.    . prednisoLONE acetate (PRED FORTE) 1 % ophthalmic suspension Place 1 drop into the left eye 3 (three) times daily.     Marland Kitchen telmisartan-hydrochlorothiazide (MICARDIS HCT) 80-12.5 MG tablet Take 1 tablet by mouth daily.     No current facility-administered medications on file prior to visit.    Cardiovascular and other pertinent studies:   EKG 02/08/2019: Sinus rhythm 71 bpm. Normal EKG.   EKG 01/17/2019: Sinus rhythm 80 bpm.  Frequent PACs.   Recent labs: 01/17/2019: Glucose 110, BUN/Cr 18/1.2. EGFR 77. Na/K 133/4.2.  Total bilirubin 1.4.  Rest of the CMP normal H/H 11/33. MCV 97. Platelets 454   Review of Systems  Cardiovascular: Positive for chest pain and syncope. Negative for dyspnea on exertion, leg swelling and palpitations.         Vitals:   02/08/19 1021 02/08/19 1022  BP: (!) 151/87 (!) 144/94  Pulse: 71 77  Temp:    SpO2:     Orthostatic vitals: Supine: 148/84 mmHg, HR 73/min Sitting: 151/87 mmHg, HR 71/min standing: 144/94 mmHg, HR 77/min  Body mass index is 28.29 kg/m. Filed Weights   02/08/19 1017  Weight: 170 lb (77.1 kg)     Objective:   Physical Exam  Constitutional: He appears well-developed and well-nourished.  Neck: No JVD present.  Cardiovascular: Normal rate, regular rhythm, normal heart sounds and intact distal pulses.  No murmur heard. Pulmonary/Chest: Effort normal and breath sounds normal. He has no wheezes. He has no rales.  Musculoskeletal:        General: No edema.  Nursing note and vitals reviewed.       Assessment & Recommendations:   69 y.o. African Ameri male with hypertension, former smoker, with syncope ans atypical chest pain.  Syncope: Occurred while standing inside the [employer]. While this was most likely vasovagal syncope, it is  concerning that he did not recognize any warning signs. He has never had syncope before. At baseline, he has atypical chest pain, with risk factors being hypertension, former h/o smoking (quit in 2007). Recommend echocardiogram, event monitor, coronary CT angiogram to exclude obstructive CAD.   Hypertension: BP elevated today, but was previously normal. Pending investigation for syncope, I have not made any changes to his medications.   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   Thank you for referring the patient to [place]. Please feel free to contact with any questions.  Nigel Mormon, MD [facility] Pager: [phone] Office: [phone]

## 2019-02-08 ENCOUNTER — Other Ambulatory Visit: Payer: Self-pay

## 2019-02-08 ENCOUNTER — Encounter: Payer: Self-pay | Admitting: Cardiology

## 2019-02-08 ENCOUNTER — Ambulatory Visit: Payer: Medicare HMO

## 2019-02-08 ENCOUNTER — Ambulatory Visit: Payer: Medicare HMO | Admitting: Cardiology

## 2019-02-08 VITALS — BP 144/94 | HR 77 | Temp 98.7°F | Ht 65.0 in | Wt 170.0 lb

## 2019-02-08 DIAGNOSIS — M109 Gout, unspecified: Secondary | ICD-10-CM | POA: Diagnosis not present

## 2019-02-08 DIAGNOSIS — R0789 Other chest pain: Secondary | ICD-10-CM

## 2019-02-08 DIAGNOSIS — Z0000 Encounter for general adult medical examination without abnormal findings: Secondary | ICD-10-CM | POA: Diagnosis not present

## 2019-02-08 DIAGNOSIS — R55 Syncope and collapse: Secondary | ICD-10-CM

## 2019-02-08 DIAGNOSIS — I10 Essential (primary) hypertension: Secondary | ICD-10-CM | POA: Diagnosis not present

## 2019-02-08 DIAGNOSIS — Z125 Encounter for screening for malignant neoplasm of prostate: Secondary | ICD-10-CM | POA: Diagnosis not present

## 2019-02-08 DIAGNOSIS — E782 Mixed hyperlipidemia: Secondary | ICD-10-CM | POA: Diagnosis not present

## 2019-02-12 ENCOUNTER — Other Ambulatory Visit: Payer: Medicare HMO

## 2019-02-14 ENCOUNTER — Ambulatory Visit (INDEPENDENT_AMBULATORY_CARE_PROVIDER_SITE_OTHER): Payer: Medicare HMO

## 2019-02-14 ENCOUNTER — Other Ambulatory Visit: Payer: Self-pay

## 2019-02-14 DIAGNOSIS — R55 Syncope and collapse: Secondary | ICD-10-CM | POA: Diagnosis not present

## 2019-02-14 DIAGNOSIS — Z0289 Encounter for other administrative examinations: Secondary | ICD-10-CM

## 2019-02-15 DIAGNOSIS — Z0000 Encounter for general adult medical examination without abnormal findings: Secondary | ICD-10-CM | POA: Diagnosis not present

## 2019-02-15 DIAGNOSIS — K219 Gastro-esophageal reflux disease without esophagitis: Secondary | ICD-10-CM | POA: Diagnosis not present

## 2019-02-15 DIAGNOSIS — E559 Vitamin D deficiency, unspecified: Secondary | ICD-10-CM | POA: Diagnosis not present

## 2019-02-15 DIAGNOSIS — E782 Mixed hyperlipidemia: Secondary | ICD-10-CM | POA: Diagnosis not present

## 2019-02-15 DIAGNOSIS — G4733 Obstructive sleep apnea (adult) (pediatric): Secondary | ICD-10-CM | POA: Diagnosis not present

## 2019-02-15 DIAGNOSIS — I10 Essential (primary) hypertension: Secondary | ICD-10-CM | POA: Diagnosis not present

## 2019-02-15 DIAGNOSIS — M1A0790 Idiopathic chronic gout, unspecified ankle and foot, without tophus (tophi): Secondary | ICD-10-CM | POA: Diagnosis not present

## 2019-02-18 ENCOUNTER — Telehealth: Payer: Self-pay | Admitting: Cardiology

## 2019-02-18 ENCOUNTER — Ambulatory Visit: Payer: Medicare HMO | Admitting: Family Medicine

## 2019-02-18 ENCOUNTER — Encounter: Payer: Self-pay | Admitting: Cardiology

## 2019-02-18 ENCOUNTER — Encounter: Payer: Self-pay | Admitting: Family Medicine

## 2019-02-18 ENCOUNTER — Other Ambulatory Visit: Payer: Self-pay

## 2019-02-18 VITALS — BP 128/70 | HR 80 | Temp 97.8°F | Ht 65.0 in | Wt 177.4 lb

## 2019-02-18 DIAGNOSIS — Z9989 Dependence on other enabling machines and devices: Secondary | ICD-10-CM | POA: Diagnosis not present

## 2019-02-18 DIAGNOSIS — I480 Paroxysmal atrial fibrillation: Secondary | ICD-10-CM | POA: Insufficient documentation

## 2019-02-18 DIAGNOSIS — G4733 Obstructive sleep apnea (adult) (pediatric): Secondary | ICD-10-CM | POA: Diagnosis not present

## 2019-02-18 MED ORDER — METOPROLOL TARTRATE 25 MG PO TABS: 25.0000 mg | ORAL_TABLET | Freq: Two times a day (BID) | ORAL | 3 refills | Status: DC

## 2019-02-18 MED ORDER — APIXABAN 5 MG PO TABS: 5.0000 mg | ORAL_TABLET | Freq: Two times a day (BID) | ORAL | 2 refills | Status: DC

## 2019-02-18 NOTE — Telephone Encounter (Signed)
Paroxysmal atrial fibrillation with RVR noted on event monitor. CHA2DS2VASc score 2, annual stroke risk 2.2%. Recommend starting Eliquis 5 mg twice daily, and metoprolol tartrate 25 mg twice daily. Coronary CTA pending.  Continue wearing event monitor to assess A. fib burden. Patient may pick up samples of Eliquis from our office.  Elder Negus, MD [facility] Pager: [phone] Office: [phone]

## 2019-02-18 NOTE — Progress Notes (Addendum)
PATIENT: Steven Brennan DOB: [birth year]  REASON FOR VISIT: follow up HISTORY FROM: patient  Chief Complaint  Patient presents with  . Follow-up    3 mon f/u. Alone. Rm 1. Patient mentioned that he is currently on a heart monitor.      HISTORY OF PRESENT ILLNESS: Today 02/18/19 Steven Brennan is a 69 y.o. male here today for follow up for OSA on CPAP. He has continued to use CPAP but admits that compliance is still sup optimum. He reports having to delay delivery of supplies due to financial concerns. He is out of work and could not afford new supplies. He has recently received new supplies and is trying to use CPAP every night. He denies difficulty with machine or supplies. He does note benefit with CPAP therapy. He is currently wearing a heart monitor.   Compliance report reveals that he used CPAP 17/30 days for compliance of 57%. He used CPAP greater than 4 hours 14/30 days for compliance of 47%. Average usage was 4 hours and 10 minutes. Residual AHI was 0.4 on 11cmH20 and EPR of 1. There was no significant leak.   HISTORY: (copied from my note on 11/19/2018)  Steven Brennan is a 69 y.o. male here today for follow up of OSA on CPAP.  He reports that over the last few months he has had difficulty using CPAP.  He has had an infection in his eye and feels that CPAP therapy was intolerable.  He has been treated by ophthalmology and infection has resolved.  He has resumed CPAP therapy but states that now he is having difficulty with dry mouth.  He has adjusted his humidity settings.  He feels that he can continue to work with humidity settings at home.  Compliance report dated 10/16/2018 through 11/14/2018 reveals that he has used CPAP 20 out of the last 30 days for compliance of 67%.  He used CPAP greater than 4 hours 15 of the last 30 days for compliance of 50%.  Average usage was 4 hours and 25 minutes.  AHI was 0.6 on 11 cm of water and an EPR of 1.  There was no significant leak  noted.  HISTORY: (copied from [employer] note on 03/12/2018)  UPDATE2/24/2020CMMr. Brennan,69 year old male returns for follow-up with newly diagnosed obstructive sleep apnea here for initial CPAP compliance. He is still getting adjusted to his machine but he has good compliance. Data dated 02/10/2018-03/11/2018 shows compliance greater than 4 hours at 93%. Average usage 5 hours 29 minutes. Set pressure 11 cm. EPR level 1 AHI 0.7. He returns for reevaluation  9/16/19[name] is a 69 year old right-handed gentleman with an underlying medical history of hypertension, reflux disease, gout, left eye injury in the past, mitral regurgitation and mild diastolic dysfunction, vitamin D deficiency and overweight state, who reports snoring and excessive daytime somnolence as well and has difficulty maintaining sleep. I reviewed your office note from 06/20/2017, which you kindly included. His Epworth sleepiness score is 9 out of 24, fatigue score is 17 out of 63. He lives alone, is currently separated. He has 6 daughters. 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 He has a bedtime of 11 PM or midnight. He falls asleep fairly quickly but uses an over-the-counter sleep aid called Power sleep p.m. He takes 2 pills each night. Rise time is around 5:30 or 6. He has nocturia about once for average night, typically around 4 AM, he denies morning headaches.  He was told in the past that he had a heart murmur. He has not been told that he has irregular heartbeat. He quit smoking over 15 years ago. He drinks alcohol occasionally, maybe once on the weekends, drinks caffeine in the form of coffee, less than one cup per day on average, more consistently in the wintertime   REVIEW OF SYSTEMS: Out of a complete 14 system review of symptoms, the patient complains only of the following symptoms, none and all other reviewed systems are negative.  ESS: 3  ALLERGIES: Allergies   Allergen Reactions  . Trazodone And Nefazodone     Passed out    HOME MEDICATIONS: Outpatient Medications Prior to Visit  Medication Sig Dispense Refill  . allopurinol (ZYLOPRIM) 100 MG tablet Take 100 mg by mouth daily.    Marland Kitchen atorvastatin (LIPITOR) 40 MG tablet Take 40 mg by mouth daily.    . folic acid (FOLVITE) 1 MG tablet Take 1 mg by mouth daily.    . methotrexate (RHEUMATREX) 2.5 MG tablet Take 10 mg by mouth once a week.    . pantoprazole (PROTONIX) 40 MG tablet Take 40 mg by mouth daily.    . prednisoLONE acetate (PRED FORTE) 1 % ophthalmic suspension Place 1 drop into the left eye 3 (three) times daily.     Marland Kitchen telmisartan-hydrochlorothiazide (MICARDIS HCT) 80-12.5 MG tablet Take 1 tablet by mouth daily.     No facility-administered medications prior to visit.    PAST MEDICAL HISTORY: Past Medical History:  Diagnosis Date  . Gout   . Hyperlipidemia   . Hypertension   . Vitamin D deficiency     PAST SURGICAL HISTORY: Past Surgical History:  Procedure Laterality Date  . EYE SURGERY  1993   due to injury    FAMILY HISTORY: Family History  Problem Relation Age of Onset  . Colon cancer Father   . Diabetes Mother   . Kidney disease Mother   . Arrhythmia Brother   . Hypertension Brother   . Hypertension Brother     SOCIAL HISTORY: Social History   Socioeconomic History  . Marital status: Legally Separated    Spouse name: Not on file  . Number of children: 4  . Years of education: Not on file  . Highest education level: Not on file  Occupational History  . Not on file  Tobacco Use  . Smoking status: Former Smoker    Types: Cigarettes    Quit date: 02/06/2006    Years since quitting: 13.0  . Smokeless tobacco: Never Used  Substance and Sexual Activity  . Alcohol use: Yes    Comment: 2 times monthly "if that"  . Drug use: No  . Sexual activity: Not on file  Other Topics Concern  . Not on file  Social History Narrative  . Not on file   Social  Determinants of Health   Financial Resource Strain:   . Difficulty of Paying Living Expenses: Not on file  Food Insecurity:   . Worried About Programme researcher, broadcasting/film/video in the Last Year: Not on file  . Ran Out of Food in the Last Year: Not on file  Transportation Needs:   . Lack of Transportation (Medical): Not on file  . Lack of Transportation (Non-Medical): Not on file  Physical Activity:   . Days of Exercise per Week: Not on file  . Minutes of Exercise per Session: Not on file  Stress:   . Feeling of Stress : Not on file  Social Connections:   . Frequency of Communication with Friends and Family: Not on file  . Frequency of Social Gatherings with Friends and Family: Not on file  . Attends Religious Services: Not on file  . Active Member of Clubs or Organizations: Not on file  . Attends Banker Meetings: Not on file  . Marital Status: Not on file  Intimate Partner Violence:   . Fear of Current or Ex-Partner: Not on file  . Emotionally Abused: Not on file  . Physically Abused: Not on file  . Sexually Abused: Not on file      PHYSICAL EXAM  Vitals:   02/18/19 0853  BP: 128/70  Pulse: 80  Temp: 97.8 F (36.6 C)  TempSrc: Oral  Weight: 177 lb 6.4 oz (80.5 kg)  Height: 5\' 5"  (1.651 m)   Body mass index is 29.52 kg/m.  Generalized: Well developed, in no acute distress  Cardiology: normal rate and rhythm, no murmur noted Respiratory: Clear to auscultation bilaterally  Neurological examination  Mentation: Alert oriented to time, place, history taking. Follows all commands speech and language fluent Cranial nerve II-XII: Pupils were equal round reactive to light. Extraocular movements were full, visual field were full  Motor: The motor testing reveals 5 over 5 strength of all 4 extremities. Good symmetric motor tone is noted throughout.  Gait and station: Gait is normal.  DIAGNOSTIC DATA (LABS, IMAGING, TESTING) - I reviewed patient records, labs, notes, testing  and imaging myself where available.  No flowsheet data found.   No results found for: WBC, HGB, HCT, MCV, PLT No results found for: NA, K, CL, CO2, GLUCOSE, BUN, CREATININE, CALCIUM, PROT, ALBUMIN, AST, ALT, ALKPHOS, BILITOT, GFRNONAA, GFRAA No results found for: CHOL, HDL, LDLCALC, LDLDIRECT, TRIG, CHOLHDL No results found for: No results found for: VITAMINB12 No results found for: TSH     ASSESSMENT AND PLAN 69 y.o. year old male  has a past medical history of Gout, Hyperlipidemia, Hypertension, and Vitamin D deficiency. here with     ICD-10-CM   1. OSA on CPAP  G47.33    Z99.89     Randeep continues CPAP therapy. Compliance report reveals sub optimal compliance. He continues to work toward nightly use and for a minimum of 4 hours each night. He does wish to continue therapy as he notes improvement in how he feels. He was encouraged to use CPAP every night. Risks of untreated sleep apnea reviewed. He will follow up with me in 3 months, sooner if needed. He verbalizes understanding and agreement with this plan.    No orders of the defined types were placed in this encounter.    No orders of the defined types were placed in this encounter.     I spent 15 minutes with the patient. 50% of this time was spent counseling and educating patient on plan of care and medications.    Peyton Najjar, FNP-C 02/18/2019, 12:43 PM [facility] [address] [place], [place] [phone]  I reviewed the above note and documentation by the Nurse Practitioner and agree with the history, exam, assessment and plan as outlined above. I was available for consultation. [phone], MD, PhD [facility])

## 2019-02-18 NOTE — Telephone Encounter (Signed)
Please give samples and start patient assistance process.

## 2019-02-18 NOTE — Patient Instructions (Signed)
Please use CPAP nightly and for greater than 4 hours each night   Follow up in 6 months   CPAP and BPAP Information CPAP and BPAP are methods of helping a person breathe with the use of air pressure. CPAP stands for "continuous positive airway pressure." BPAP stands for "bi-level positive airway pressure." In both methods, air is blown through your nose or mouth and into your air passages to help you breathe well. CPAP and BPAP use different amounts of pressure to blow air. With CPAP, the amount of pressure stays the same while you breathe in and out. With BPAP, the amount of pressure is increased when you breathe in (inhale) so that you can take larger breaths. Your health care provider will recommend whether CPAP or BPAP would be more helpful for you. Why are CPAP and BPAP treatments used? CPAP or BPAP can be helpful if you have:  Sleep apnea.  Chronic obstructive pulmonary disease (COPD).  Heart failure.  Medical conditions that weaken the muscles of the chest including muscular dystrophy, or neurological diseases such as amyotrophic lateral sclerosis (ALS).  Other problems that cause breathing to be weak, abnormal, or difficult. CPAP is most commonly used for obstructive sleep apnea (OSA) to keep the airways from collapsing when the muscles relax during sleep. How is CPAP or BPAP administered? Both CPAP and BPAP are provided by a small machine with a flexible plastic tube that attaches to a plastic mask. You wear the mask. Air is blown through the mask into your nose or mouth. The amount of pressure that is used to blow the air can be adjusted on the machine. Your health care provider will determine the pressure setting that should be used based on your individual needs. When should CPAP or BPAP be used? In most cases, the mask only needs to be worn during sleep. Generally, the mask needs to be worn throughout the night and during any daytime naps. People with certain medical conditions  may also need to wear the mask at other times when they are awake. Follow instructions from your health care provider about when to use the machine. What are some tips for using the mask?   Because the mask needs to be snug, some people feel trapped or closed-in (claustrophobic) when first using the mask. If you feel this way, you may need to get used to the mask. One way to do this is by holding the mask loosely over your nose or mouth and then gradually applying the mask more snugly. You can also gradually increase the amount of time that you use the mask.  Masks are available in various types and sizes. Some fit over your mouth and nose while others fit over just your nose. If your mask does not fit well, talk with your health care provider about getting a different one.  If you are using a mask that fits over your nose and you tend to breathe through your mouth, a chin strap may be applied to help keep your mouth closed.  The CPAP and BPAP machines have alarms that may sound if the mask comes off or develops a leak.  If you have trouble with the mask, it is very important that you talk with your health care provider about finding a way to make the mask easier to tolerate. Do not stop using the mask. Stopping the use of the mask could have a negative impact on your health. What are some tips for using the machine?  Place your CPAP or BPAP machine on a secure table or stand near an electrical outlet.  Know where the on/off switch is located on the machine.  Follow instructions from your health care provider about how to set the pressure on your machine and when you should use it.  Do not eat or drink while the CPAP or BPAP machine is on. Food or fluids could get pushed into your lungs by the pressure of the CPAP or BPAP.  Do not smoke. Tobacco smoke residue can damage the machine.  For home use, CPAP and BPAP machines can be rented or purchased through home health care companies. Many  different brands of machines are available. Renting a machine before purchasing may help you find out which particular machine works well for you.  Keep the CPAP or BPAP machine and attachments clean. Ask your health care provider for specific instructions. Get help right away if:  You have redness or open areas around your nose or mouth where the mask fits.  You have trouble using the CPAP or BPAP machine.  You cannot tolerate wearing the CPAP or BPAP mask.  You have pain, discomfort, and bloating in your abdomen. Summary  CPAP and BPAP are methods of helping a person breathe with the use of air pressure.  Both CPAP and BPAP are provided by a small machine with a flexible plastic tube that attaches to a plastic mask.  If you have trouble with the mask, it is very important that you talk with your health care provider about finding a way to make the mask easier to tolerate. This information is not intended to replace advice given to you by your health care provider. Make sure you discuss any questions you have with your health care provider. Document Revised: 04/25/2018 Document Reviewed: 11/23/2015 Elsevier Patient Education  [place].   Sleep Apnea Sleep apnea affects breathing during sleep. It causes breathing to stop for a short time or to become shallow. It can also increase the risk of:  Heart attack.  Stroke.  Being very overweight (obese).  Diabetes.  Heart failure.  Irregular heartbeat. The goal of treatment is to help you breathe normally again. What are the causes? There are three kinds of sleep apnea:  Obstructive sleep apnea. This is caused by a blocked or collapsed airway.  Central sleep apnea. This happens when the brain does not send the right signals to the muscles that control breathing.  Mixed sleep apnea. This is a combination of obstructive and central sleep apnea. The most common cause of this condition is a collapsed or blocked airway.  This can happen if:  Your throat muscles are too relaxed.  Your tongue and tonsils are too large.  You are overweight.  Your airway is too small. What increases the risk?  Being overweight.  Smoking.  Having a small airway.  Being older.  Being male.  Drinking alcohol.  Taking medicines to calm yourself (sedatives or tranquilizers).  Having family members with the condition. What are the signs or symptoms?  Trouble staying asleep.  Being sleepy or tired during the day.  Getting angry a lot.  Loud snoring.  Headaches in the morning.  Not being able to focus your mind (concentrate).  Forgetting things.  Less interest in sex.  Mood swings.  Personality changes.  Feelings of sadness (depression).  Waking up a lot during the night to pee (urinate).  Dry mouth.  Sore throat. How is this diagnosed?  Your medical  history.  A physical exam.  A test that is done when you are sleeping (sleep study). The test is most often done in a sleep lab but may also be done at home. How is this treated?   Sleeping on your side.  Using a medicine to get rid of mucus in your nose (decongestant).  Avoiding the use of alcohol, medicines to help you relax, or certain pain medicines (narcotics).  Losing weight, if needed.  Changing your diet.  Not smoking.  Using a machine to open your airway while you sleep, such as: ? An oral appliance. This is a mouthpiece that shifts your lower jaw forward. ? A CPAP device. This device blows air through a mask when you breathe out (exhale). ? An EPAP device. This has valves that you put in each nostril. ? A BPAP device. This device blows air through a mask when you breathe in (inhale) and breathe out.  Having surgery if other treatments do not work. It is important to get treatment for sleep apnea. Without treatment, it can lead to:  High blood pressure.  Coronary artery disease.  In men, not being able to have an  erection (impotence).  Reduced thinking ability. Follow these instructions at home: Lifestyle  Make changes that your doctor recommends.  Eat a healthy diet.  Lose weight if needed.  Avoid alcohol, medicines to help you relax, and some pain medicines.  Do not use any products that contain nicotine or tobacco, such as cigarettes, e-cigarettes, and chewing tobacco. If you need help quitting, ask your doctor. General instructions  Take over-the-counter and prescription medicines only as told by your doctor.  If you were given a machine to use while you sleep, use it only as told by your doctor.  If you are having surgery, make sure to tell your doctor you have sleep apnea. You may need to bring your device with you.  Keep all follow-up visits as told by your doctor. This is important. Contact a doctor if:  The machine that you were given to use during sleep bothers you or does not seem to be working.  You do not get better.  You get worse. Get help right away if:  Your chest hurts.  You have trouble breathing in enough air.  You have an uncomfortable feeling in your back, arms, or stomach.  You have trouble talking.  One side of your body feels weak.  A part of your face is hanging down. These symptoms may be an emergency. Do not wait to see if the symptoms will go away. Get medical help right away. Call your local emergency services (911 in the [place] Do not drive yourself to the hospital. Summary  This condition affects breathing during sleep.  The most common cause is a collapsed or blocked airway.  The goal of treatment is to help you breathe normally while you sleep. This information is not intended to replace advice given to you by your health care provider. Make sure you discuss any questions you have with your health care provider. Document Revised: 10/20/2017 Document Reviewed: 08/29/2017 Elsevier Patient Education  2020 [employer].

## 2019-02-18 NOTE — Telephone Encounter (Signed)
Called pt to inform him about the message below. He mention that the eliquis was to expensive and would like to know id there anything else he can take.

## 2019-02-19 NOTE — Telephone Encounter (Signed)
Called pt to inform him about the pt assistance program. Pt understood and will bring his proof of income.

## 2019-03-18 ENCOUNTER — Ambulatory Visit: Payer: Medicare HMO | Admitting: Cardiology

## 2019-03-19 DIAGNOSIS — H30033 Focal chorioretinal inflammation, peripheral, bilateral: Secondary | ICD-10-CM | POA: Diagnosis not present

## 2019-03-19 DIAGNOSIS — Z79899 Other long term (current) drug therapy: Secondary | ICD-10-CM | POA: Diagnosis not present

## 2019-03-22 ENCOUNTER — Telehealth: Payer: Self-pay

## 2019-03-22 ENCOUNTER — Ambulatory Visit: Payer: Medicare HMO | Admitting: Cardiology

## 2019-03-22 NOTE — Progress Notes (Deleted)
Patient referred by Tammi Klippel* for syncope  Subjective:   Steven Brennan, male    DOB: Nov 30, 1950, 69 y.o.   MRN: [id number]  *** No chief complaint on file.   HPI  69 y.o. African Ameri male with hypertension, former smoker, with syncope.   Patient was found to have paroxysmal atrial fibrillation with RVR noted on event monitor. CHA2DS2VASc score 2, annual stroke risk 2.2%.  I spoke with the patient over the phone and recommended starting Eliquis 5 mg twice daily, and metoprolol tartrate 25 mg twice daily.  It does not appear that he underwent coronary CT angiogram, as recommended.  Coronary CTA pending.  Continue wearing event monitor to assess A. fib burden. Patient may pick up samples of Eliquis from our office.   Initial consultation HPI 01/2019: Patient is a truck driver. Three weeks ago, he was standing inside the [employer] in line. He admits that he had not had anything to eat or drink that morning, and had taking a sleeping pill, he thinks it was trazodone, the night before. He had sudden onset of flushing sensation, followed by sudden loss of consciousness. He did not have any preceding chest pain, shortness of breath, lightheadedness symptoms. He does not know how long he lost consciousness for. When he regained consciousness, he was surrounded by people who called EMS. EMS arrived, checked his BP, which was reportedly SBP 118 mmHg. He was not taken to the hospital. He has not had any recurrence of syncope since then.  At baseline, he is a former smoker, is on antihypertensive medications. He walks about a mile 2-3 days a week.  He endorses "chest congestion" when he is walking, that improves after he clears his throat.      Current Outpatient Medications on File Prior to Visit  Medication Sig Dispense Refill  . allopurinol (ZYLOPRIM) 100 MG tablet Take 100 mg by mouth daily.    Marland Kitchen apixaban (ELIQUIS) 5 MG TABS tablet Take 1 tablet (5 mg total) by mouth 2 (two) times  daily. 60 tablet 2  . atorvastatin (LIPITOR) 40 MG tablet Take 40 mg by mouth daily.    . folic acid (FOLVITE) 1 MG tablet Take 1 mg by mouth daily.    . methotrexate (RHEUMATREX) 2.5 MG tablet Take 10 mg by mouth once a week.    . metoprolol tartrate (LOPRESSOR) 25 MG tablet Take 1 tablet (25 mg total) by mouth 2 (two) times daily. 60 tablet 3  . pantoprazole (PROTONIX) 40 MG tablet Take 40 mg by mouth daily.    . prednisoLONE acetate (PRED FORTE) 1 % ophthalmic suspension Place 1 drop into the left eye 3 (three) times daily.     Marland Kitchen telmisartan-hydrochlorothiazide (MICARDIS HCT) 80-12.5 MG tablet Take 1 tablet by mouth daily.     No current facility-administered medications on file prior to visit.    Cardiovascular and other pertinent studies:   Echocardiogram 02/14/2019:  Left ventricle cavity is normal in size and wall thickness. Normal LV  systolic function with EF 57%. Normal global wall motion. Doppler evidence  of grade I (impaired) diastolic dysfunction, normal LAP.  Aneurysmal interatrial septum without 2D or color Doppler evidence of  shunting.  Trileaflet aortic valve with mild calcification. Trace aortic stenosis.  Mild mitral regurgitation.  Mild tricuspid regurgitation.  Estimated pulmonary artery systolic pressure 25 mmHg.  EKG 02/08/2019: Sinus rhythm 71 bpm. Normal EKG.   EKG 01/17/2019: Sinus rhythm 80 bpm.  Frequent PACs.   Recent labs: 01/17/2019:  Glucose 110, BUN/Cr 18/1.2. EGFR 77. Na/K 133/4.2.  Total bilirubin 1.4.  Rest of the CMP normal H/H 11/33. MCV 97. Platelets 454   Review of Systems  Cardiovascular: Positive for chest pain and syncope. Negative for dyspnea on exertion, leg swelling and palpitations.         There were no vitals filed for this visit. Orthostatic vitals: Supine: 148/84 mmHg, HR 73/min Sitting: 151/87 mmHg, HR 71/min standing: 144/94 mmHg, HR 77/min  *** There is no height or weight on file to calculate BMI. There were  no vitals filed for this visit.   Objective:   Physical Exam  Constitutional: He appears well-developed and well-nourished.  Neck: No JVD present.  Cardiovascular: Normal rate, regular rhythm, normal heart sounds and intact distal pulses.  No murmur heard. Pulmonary/Chest: Effort normal and breath sounds normal. He has no wheezes. He has no rales.  Musculoskeletal:        General: No edema.  Nursing note and vitals reviewed.       Assessment & Recommendations:   69 y.o. African Ameri male with hypertension, former smoker, with syncope ans atypical chest pain.  Syncope: Occurred while standing inside the [employer]. While this was most likely vasovagal syncope, it is concerning that he did not recognize any warning signs. He has never had syncope before. At baseline, he has atypical chest pain, with risk factors being hypertension, former h/o smoking (quit in 2007). Recommend echocardiogram, event monitor, coronary CT angiogram to exclude obstructive CAD.   Hypertension: BP elevated today, but was previously normal. Pending investigation for syncope, I have not made any changes to his medications.   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   Thank you for referring the patient to [place]. Please feel free to contact with any questions.  Nigel Mormon, MD [facility] Pager: [phone] Office: [phone]

## 2019-04-05 DIAGNOSIS — H3581 Retinal edema: Secondary | ICD-10-CM | POA: Diagnosis not present

## 2019-04-05 DIAGNOSIS — H2702 Aphakia, left eye: Secondary | ICD-10-CM | POA: Diagnosis not present

## 2019-04-05 DIAGNOSIS — H30033 Focal chorioretinal inflammation, peripheral, bilateral: Secondary | ICD-10-CM | POA: Diagnosis not present

## 2019-04-05 DIAGNOSIS — Z79899 Other long term (current) drug therapy: Secondary | ICD-10-CM | POA: Diagnosis not present

## 2019-04-14 ENCOUNTER — Telehealth (HOSPITAL_COMMUNITY): Payer: Self-pay | Admitting: Emergency Medicine

## 2019-04-14 NOTE — Telephone Encounter (Signed)
Pt returning phone call regarding upcoming cardiac imaging study; pt verbalizes understanding of appt date/time, parking situation and where to check in, pre-test NPO status and medications ordered, and verified current allergies; name and call back number provided for further questions should they arise Rockwell Alexandria RN Navigator Cardiac Imaging [facility] [phone] office [phone] cell  Clarified medication instructions as he was not given any specific instructions for the test by [facility].  Pt appreciated the phone call.  [place] Dec

## 2019-04-14 NOTE — Telephone Encounter (Signed)
Attempted to call patient regarding upcoming cardiac CT appointment. °Left message on voicemail with name and callback number °Tex Conroy RN Navigator Cardiac Imaging °[facility] °[phone] Office °[phone] Cell ° °

## 2019-04-15 ENCOUNTER — Encounter (HOSPITAL_COMMUNITY): Payer: Self-pay

## 2019-04-15 ENCOUNTER — Other Ambulatory Visit: Payer: Self-pay

## 2019-04-15 ENCOUNTER — Ambulatory Visit (HOSPITAL_COMMUNITY)
Admission: RE | Admit: 2019-04-15 | Discharge: 2019-04-15 | Disposition: A | Discharge status: Home / Self Care | Payer: Medicare HMO | Source: Ambulatory Visit | Attending: Cardiology | Admitting: Cardiology

## 2019-04-15 DIAGNOSIS — R0789 Other chest pain: Secondary | ICD-10-CM

## 2019-04-15 DIAGNOSIS — I10 Essential (primary) hypertension: Secondary | ICD-10-CM

## 2019-04-15 DIAGNOSIS — Z87891 Personal history of nicotine dependence: Secondary | ICD-10-CM | POA: Diagnosis not present

## 2019-04-15 DIAGNOSIS — I700 Atherosclerosis of aorta: Secondary | ICD-10-CM | POA: Diagnosis not present

## 2019-04-15 DIAGNOSIS — I771 Stricture of artery: Secondary | ICD-10-CM | POA: Diagnosis not present

## 2019-04-15 DIAGNOSIS — R55 Syncope and collapse: Secondary | ICD-10-CM | POA: Diagnosis present

## 2019-04-15 DIAGNOSIS — I25118 Atherosclerotic heart disease of native coronary artery with other forms of angina pectoris: Secondary | ICD-10-CM | POA: Insufficient documentation

## 2019-04-15 DIAGNOSIS — Z6829 Body mass index (BMI) 29.0-29.9, adult: Secondary | ICD-10-CM | POA: Diagnosis not present

## 2019-04-15 DIAGNOSIS — I2510 Atherosclerotic heart disease of native coronary artery without angina pectoris: Secondary | ICD-10-CM | POA: Diagnosis not present

## 2019-04-15 IMAGING — CT CT HEART MORP W/ CTA COR W/ SCORE W/ CA W/CM &/OR W/O CM
4 of 7 series · 8 of 20 positions shown, 9 images · IV contrast (APPLIED)
Comparison: None.
COMPARISON: None.

Addendum:
EXAM:
OVER-READ INTERPRETATION  CT CHEST

The following report is an over-read performed by radiologist Dr.
Azouza Hamawi [REDACTED] on 04/15/2019. This
over-read does not include interpretation of cardiac or coronary
anatomy or pathology. The coronary calcium score/coronary CTA
interpretation by the cardiologist is attached.
HISTORY: Chest pain, nonspecific Atypical chest pain, syncope, truck driver
Cardiac/Coronary  CT
TECHNIQUE: The patient was scanned on a Siemens Force scanner.
PROTOCOL: A 120 kV prospective scan was triggered in the descending thoracic
aorta at 111 HU's. Axial non-contrast 3 mm slices were carried out
through the heart. The data set was analyzed on a dedicated work
station and scored using the Agatson method. Gantry rotation speed
was 250 msecs and collimation was .6 mm. Beta blockade and 0.8 mg of
sl NTG was given. The 3D data set was reconstructed in 5% intervals
of the 67-82 % of the R-R cycle. Diastolic phases were analyzed on a
dedicated work station using MPR, MIP and VRT modes. The patient
received 80mL OMNIPAQUE IOHEXOL 350 MG/ML SOLN of contrast.

[Series 6: best diast 71 % · axial · 0.37mm/px · z∈[+1173,+1212]mm · 2 of 291 slices shown, 3 images]
[im 97/291  vessel]
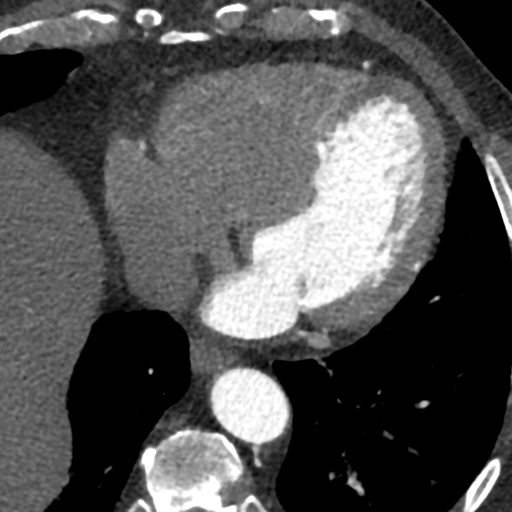
[im 97/291  lung]
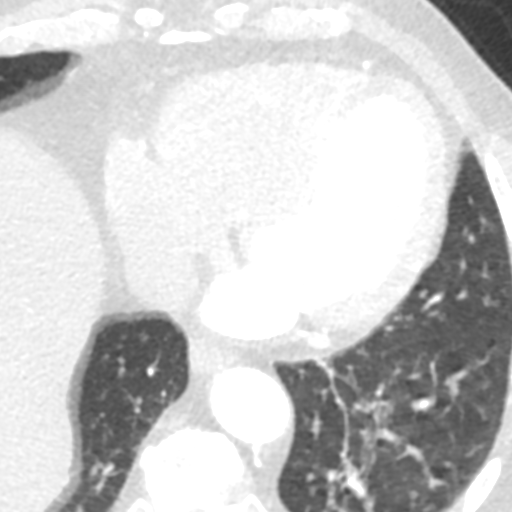
[im 194/291  vessel]
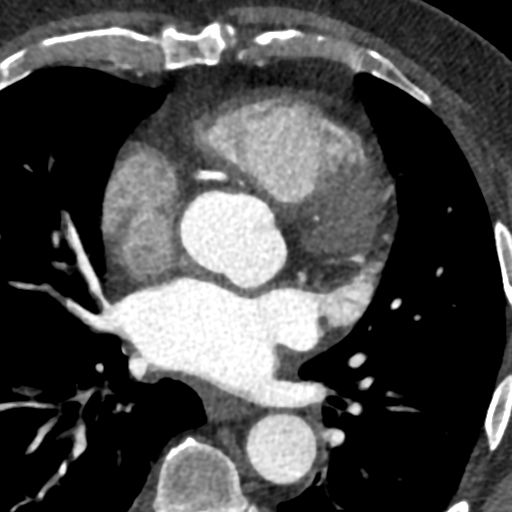

[Series 7: best syst 38 % · axial · 0.37mm/px · z∈[+1173,+1212]mm · 2 of 291 slices shown]
[im 97/291  vessel]
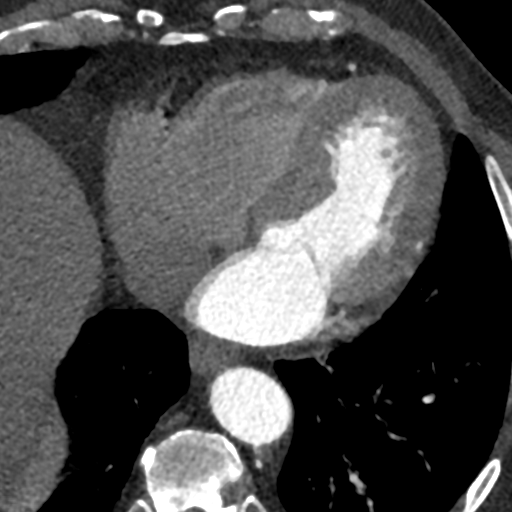
[im 194/291  vessel]
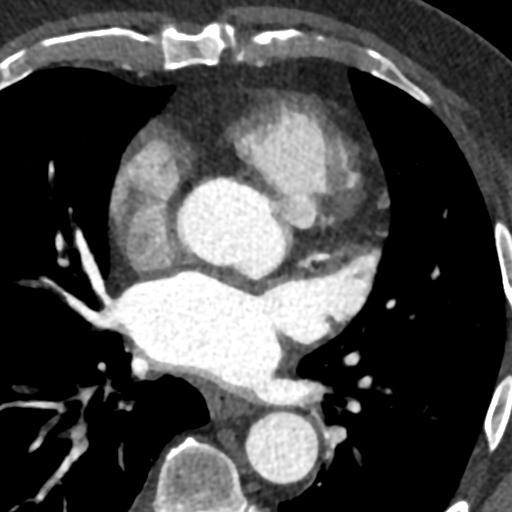

[Series 8: ts diast sharp 38 % · axial · 0.37mm/px · z∈[+1173,+1212]mm · 2 of 291 slices shown]
[im 97/291  lung]
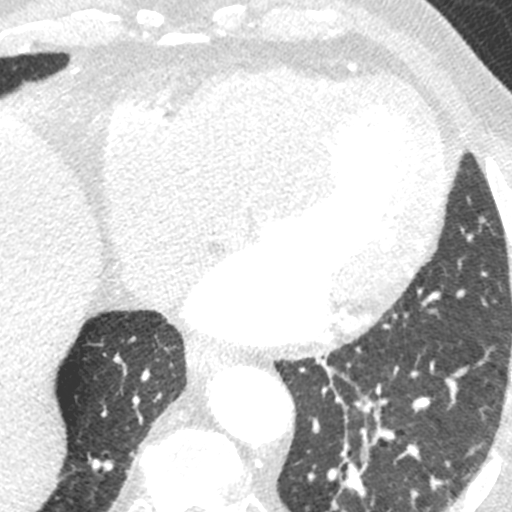
[im 194/291  lung]
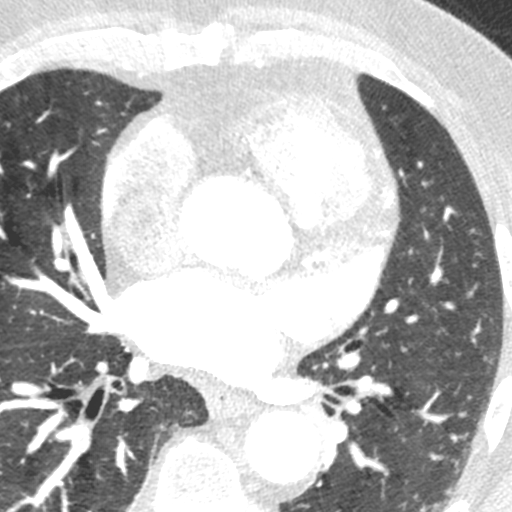

[Series 9: ts syst sharp 38 % · axial · 0.37mm/px · z∈[+1173,+1212]mm · 2 of 291 slices shown]
[im 97/291  lung]
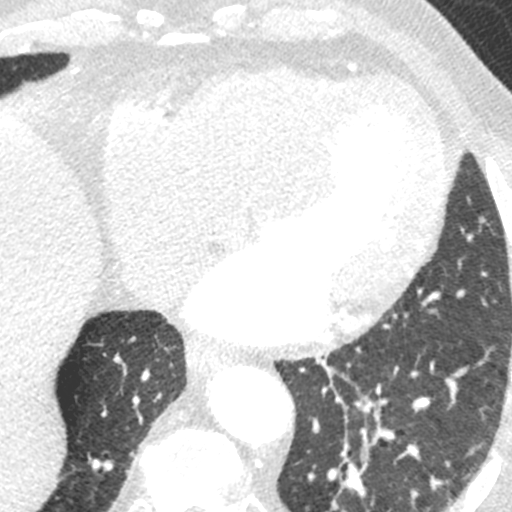
[im 194/291  lung]
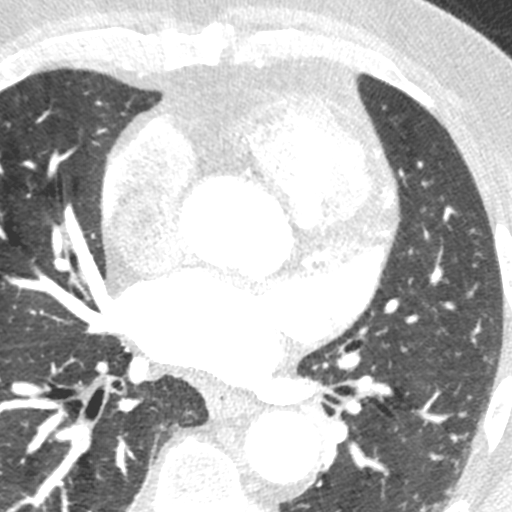

[8 of 20 positions shown; findings below may reference images not displayed]

FINDINGS: Aortic atherosclerosis. Mild linear scarring in the left lower lobe.
Within the visualized portions of the thorax there are no suspicious
appearing pulmonary nodules or masses, there is no acute
consolidative airspace disease, no pleural effusions, no
pneumothorax and no lymphadenopathy. Visualized portions of the
upper abdomen are unremarkable. There are no aggressive appearing
lytic or blastic lesions noted in the visualized portions of the
skeleton.
IMPRESSION: 1.  Aortic Atherosclerosis (E4JMF-5GN.N).
FINDINGS: Coronary calcium score is 283, which places the patient in the 82
percentile for age and sex matched control.

Coronary arteries: Normal coronary origins.  Right dominance.

Right Coronary Artery: Mild atherosclerotic plaque in the proximal
RCA, 25-49% stenosis. Moderate mixed atherosclerotic plaque in the
mid RCA, 50-69% stenosis, two serial lesions. Moderate mixed
atherosclerotic plaque in the distal RCA, 50-69% stenosis. Moderate
mixed atherosclerotic plaque in the proximal PDA. Small caliber PLA
with diffuse disease.

Left Main Coronary Artery: Mild atherosclerotic plaque in the mid
LM, 25-49% stenosis. Mild mixed atherosclerotic plaque in the distal
LM at the trifurcation of the LAD, L Circumflex and ramus branch.

Left Anterior Descending Coronary Artery: Minimal atherosclerotic
and mixed atherosclerotic plaque in the proximal and mid LAD, <25%
stenosis, followed by a 37 mm segment of myocardial bridging to a
depth of 4.5 mm. Severe mixed atherosclerotic plaque in the distal
LAD at the apex, 70-99% stenosis. The distal LAD wraps around the
apex. There is a high takeoff of D1 vs dual LAD, with no significant
plaque or stenosis.

Ramus intermedius: Mild mixed atherosclerotic plaque at the ostium
and mid vessel, 25-49% stenosis.

Left Circumflex Artery: Mild atherosclerotic and mixed
atherosclerotic plaque in the proximal L circumflex artery, 25-49%
stenosis.

Aorta: Normal size, 33 mm at the mid ascending aorta (level of the
PA bifurcation) measured double oblique. No calcifications. No
dissection.

Aortic Valve: Mild calcifications.  AV calcium score 258.

Other findings:

Normal pulmonary vein drainage into the left atrium.

Normal left atrial appendage without a thrombus.

Normal size of the pulmonary artery.
IMPRESSION: 1. Moderate CAD in the RCA, CADRADS = 3. CT FFR will be performed
and reported separately.

2. Coronary calcium score is 283, which places the patient in the 82
percentile for age and sex matched control.

3. Normal coronary origin with right dominance.

4.  Mild aortic valve calcifications, AV calcium score 258.

*** End of Addendum ***
EXAM:
OVER-READ INTERPRETATION  CT CHEST

The following report is an over-read performed by radiologist Dr.
Azouza Hamawi [REDACTED] on 04/15/2019. This
over-read does not include interpretation of cardiac or coronary
anatomy or pathology. The coronary calcium score/coronary CTA
interpretation by the cardiologist is attached.
FINDINGS: Aortic atherosclerosis. Mild linear scarring in the left lower lobe.
Within the visualized portions of the thorax there are no suspicious
appearing pulmonary nodules or masses, there is no acute
consolidative airspace disease, no pleural effusions, no
pneumothorax and no lymphadenopathy. Visualized portions of the
upper abdomen are unremarkable. There are no aggressive appearing
lytic or blastic lesions noted in the visualized portions of the
skeleton.
IMPRESSION: 1.  Aortic Atherosclerosis ([id number].N).

## 2019-04-15 MED ORDER — NITROGLYCERIN 0.4 MG SL SUBL
0.8000 mg | SUBLINGUAL_TABLET | Freq: Once | SUBLINGUAL | Status: AC
Start: 2019-04-15 — End: 2019-04-15
  Administered 2019-04-15: 0.8 mg via SUBLINGUAL

## 2019-04-15 MED ORDER — NITROGLYCERIN 0.4 MG SL SUBL
SUBLINGUAL_TABLET | SUBLINGUAL | Status: AC
  Filled 2019-04-15: qty 2

## 2019-04-15 MED ORDER — IOHEXOL 350 MG/ML SOLN
80.0000 mL | Freq: Once | INTRAVENOUS | Status: AC | PRN
  Administered 2019-04-15: 80 mL via INTRAVENOUS

## 2019-04-15 NOTE — Progress Notes (Signed)
Pt tolerated procedure very well. DC home

## 2019-04-19 DIAGNOSIS — Z87891 Personal history of nicotine dependence: Secondary | ICD-10-CM | POA: Diagnosis not present

## 2019-04-19 DIAGNOSIS — I2510 Atherosclerotic heart disease of native coronary artery without angina pectoris: Secondary | ICD-10-CM | POA: Diagnosis not present

## 2019-04-19 DIAGNOSIS — I771 Stricture of artery: Secondary | ICD-10-CM

## 2019-04-19 DIAGNOSIS — Z6829 Body mass index (BMI) 29.0-29.9, adult: Secondary | ICD-10-CM | POA: Diagnosis not present

## 2019-04-20 NOTE — Progress Notes (Signed)
Appt was to be rescheduled after CTA. CTA is completed. Please schedule office or virtual visit in the coming week or two.  Thanks MJP

## 2019-04-22 ENCOUNTER — Telehealth: Payer: Self-pay

## 2019-04-22 NOTE — Telephone Encounter (Signed)
Patient called requesting his

## 2019-05-06 DIAGNOSIS — E782 Mixed hyperlipidemia: Secondary | ICD-10-CM | POA: Diagnosis not present

## 2019-05-06 DIAGNOSIS — G4733 Obstructive sleep apnea (adult) (pediatric): Secondary | ICD-10-CM | POA: Diagnosis not present

## 2019-05-06 DIAGNOSIS — R55 Syncope and collapse: Secondary | ICD-10-CM | POA: Diagnosis not present

## 2019-05-08 ENCOUNTER — Encounter: Payer: Self-pay | Admitting: Cardiology

## 2019-05-08 ENCOUNTER — Ambulatory Visit: Payer: Managed Care, Other (non HMO) | Admitting: Cardiology

## 2019-05-08 ENCOUNTER — Other Ambulatory Visit: Payer: Self-pay

## 2019-05-08 VITALS — BP 134/72 | HR 58 | Temp 97.5°F | Resp 15 | Ht 65.0 in | Wt 175.0 lb

## 2019-05-08 DIAGNOSIS — I480 Paroxysmal atrial fibrillation: Secondary | ICD-10-CM

## 2019-05-08 DIAGNOSIS — R55 Syncope and collapse: Secondary | ICD-10-CM | POA: Diagnosis not present

## 2019-05-08 DIAGNOSIS — I2510 Atherosclerotic heart disease of native coronary artery without angina pectoris: Secondary | ICD-10-CM

## 2019-05-08 NOTE — Progress Notes (Signed)
  Patient referred by Ramachandran, [name]* for syncope  Subjective:   Steven Brennan, male    DOB: 02/25/1950, 69 y.o.   MRN: [id number]   Chief Complaint  Patient presents with  . Follow-up  . Results    HPI  69 y.o. African Ameri male with hypertension, CAD, paroxysmal Afib, h/o syncope.  Workup showed CAD without any severe proximal disease, paroxysmal Afib. Patient is doing well. He has not had any recurrent syncope.   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 He admits that he had not had anything to eat or drink that morning, and had taking a sleeping pill, he thinks it was trazodone, the night before. He had sudden onset of flushing sensation, followed by sudden loss of consciousness. He did not have any preceding chest pain, shortness of breath, lightheadedness symptoms. He does not know how long he lost consciousness for. When he regained consciousness, he was surrounded by people who called EMS. EMS arrived, checked his BP, which was reportedly SBP 118 mmHg. He was not taken to the hospital. He has not had any recurrence of syncope since then.  At baseline, he is a former smoker, is on antihypertensive medications. He walks about a mile 2-3 days a week.  He endorses "chest congestion" when he is walking, that improves after he clears his throat.    Current Outpatient Medications on File Prior to Visit  Medication Sig Dispense Refill  . allopurinol (ZYLOPRIM) 100 MG tablet Take 100 mg by mouth daily.    . apixaban (ELIQUIS) 5 MG TABS tablet Take 1 tablet (5 mg total) by mouth 2 (two) times daily. 60 tablet 2  . atorvastatin (LIPITOR) 40 MG tablet Take 40 mg by mouth daily.    . folic acid (FOLVITE) 1 MG tablet Take 1 mg by mouth daily.    . methotrexate (RHEUMATREX) 2.5 MG tablet Take 10 mg by mouth once a week.    . metoprolol tartrate (LOPRESSOR) 25 MG tablet Take 1 tablet (25 mg total) by mouth 2  (two) times daily. 60 tablet 3  . pantoprazole (PROTONIX) 40 MG tablet Take 40 mg by mouth daily.    . prednisoLONE acetate (PRED FORTE) 1 % ophthalmic suspension Place 1 drop into the left eye 3 (three) times daily.     . telmisartan-hydrochlorothiazide (MICARDIS HCT) 80-12.5 MG tablet Take 1 tablet by mouth daily.     No current facility-administered medications on file prior to visit.    Cardiovascular and other pertinent studies:  CTA 04/15/2019: 1. Left Main: FFR = 0.96 2. LAD: Proximal FFR = 0.94, Mid FFR = 0.89, Distal FFR = 0.50, Diagonal 1 FFR = 0.93 3. Ramus intermedius FFR = proximal 0.92, distal 0.84 4. LCX: Proximal FFR = 0.88, Distal FFR = 0.83, distal portion not mapped. 5. RCA: Proximal FFR =0.96, Mid FFR = 0.93, Distal FFR = 0.90  IMPRESSION: 1. CT FFR analysis showed hemodynamically significant stenosis in the distal portion of the LAD. The vessel is small caliber at this location.  2.  No hemodynamically significant stenoses in the RCA.  Event monitor 02/08/2019 - 03/09/2019:  Diagnostic time: 100%  Dominant rhythm: Sinus.  HR 47-139 bpm. Avg HR 71 bpm.  Episodes of Afib w/RVR notes. Afib burden <1%.  Occasional 3-4 beat NSVT noted.  No other arrhythmia seen.  No symptoms reported.    EKG 02/08/2019: Sinus rhythm 71 bpm. Normal EKG.     EKG 01/17/2019: Sinus rhythm 80 bpm.  Frequent PACs.   Recent labs: 01/17/2019: Glucose 110, BUN/Cr 18/1.2. EGFR 77. Na/K 133/4.2.  Total bilirubin 1.4.  Rest of the CMP normal H/H 11/33. MCV 97. Platelets 454   Review of Systems  Cardiovascular: Negative for chest pain, dyspnea on exertion, leg swelling, palpitations and syncope.         Vitals:   05/08/19 1453  BP: 134/72  Pulse: (!) 58  Resp: 15  Temp: (!) 97.5 F (36.4 C)  SpO2: 97%      Body mass index is 29.12 kg/m. Filed Weights   05/08/19 1453  Weight: 175 lb (79.4 kg)     Objective:   Physical Exam  Constitutional: He appears  well-developed and well-nourished.  Neck: No JVD present.  Cardiovascular: Normal rate, regular rhythm, normal heart sounds and intact distal pulses.  No murmur heard. Pulmonary/Chest: Effort normal and breath sounds normal. He has no wheezes. He has no rales.  Musculoskeletal:        General: No edema.  Nursing note and vitals reviewed.       Assessment & Recommendations:   69 y.o. African Ameri male with hypertension, CAD, paroxysmal Afib, h/o syncope.  CAD: Largely nonobstructive, except distal small caliber LAD. Continue medical management. Not on Aspirin due to ongoing use of Eliquis for Afib. Continue hypertension, hyperlipidemia management. Continue statin.   Syncope: No recurrence. Likely vasovagal. Occurred in Dec 2020. 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   Paroxysmal Afib: CHA2DS2VASc score 2, annual stroke risk 2.2%. Continue Eliquis 5 mg twice daily, and metoprolol tartrate 25 mg twice daily. Eliquis samples given.   Hypertension: Controlled  F/u in 6 months  Steven J Patwardhan, MD [facility] Pager: [phone] Office: [phone] 

## 2019-05-09 ENCOUNTER — Encounter: Payer: Self-pay | Admitting: Cardiology

## 2019-05-12 ENCOUNTER — Other Ambulatory Visit: Payer: Self-pay | Admitting: Cardiology

## 2019-05-12 DIAGNOSIS — I480 Paroxysmal atrial fibrillation: Secondary | ICD-10-CM

## 2019-05-21 NOTE — Progress Notes (Addendum)
PATIENT: Steven Brennan DOB: [birth year]  REASON FOR VISIT: follow up HISTORY FROM: patient  Chief Complaint  Patient presents with  . Follow-up    RM 5 here for a cpap     HISTORY OF PRESENT ILLNESS: Today 05/22/19 Steven Brennan is a 69 y.o. male here today for follow up for OSA on CPAP.  He reports that he is doing better with CPAP therapy at home.  He has been more consistent with daily and 4-hour compliance.  He has not noted any significant difference in how he feels.  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  He feels that this will help as he will have a regular schedule.  He is currently using a nasal pillow.  He does report dry mouth and dry eyes on occasion.  Compliance report dated 04/21/2019 through 05/20/2019 reveals that he used CPAP 25 of the past 30 days for compliance of 83%.  He used CPAP greater than 4 hours 23 of the last 30 days for compliance of 77%.  Average usage was 5 hours and 5 minutes.  Residual AHI was 0.5 on 11 cm of water and an EPR of 1.  Leak noted in the centile at 15.2 L/min.  HISTORY: (copied from my note on 02/18/2019)  Steven Brennan is a 69 y.o. male here today for follow up for OSA on CPAP. He has continued to use CPAP but admits that compliance is still sup optimum. He reports having to delay delivery of supplies due to financial concerns. He is out of work and could not afford new supplies. He has recently received new supplies and is trying to use CPAP every night. He denies difficulty with machine or supplies. He does note benefit with CPAP therapy. He is currently wearing a heart monitor.   Compliance report reveals that he used CPAP 17/30 days for compliance of 57%. He used CPAP greater than 4 hours 14/30 days for compliance of 47%. Average usage was 4 hours and 10 minutes. Residual AHI was 0.4 on 11cmH20 and EPR of 1. There was no significant leak.   HISTORY: (copied from my note on 11/19/2018)  Steven Brennan a 68 y.o.malehere today  for follow up of OSA on CPAP.He reports that over the last few months he has had difficulty using CPAP. He has had an infection in his eye and feels that CPAP therapy was intolerable.He has been treated by ophthalmology and infection has resolved. He has resumed CPAP therapy but states that now he is having difficulty with dry mouth. He has adjusted his humidity settings.He feels that he can continue to work with humidity settings at home.  Compliance report dated 10/16/2018 through 11/14/2018 reveals that he has used CPAP 20 out of the last 30 days for compliance of 67%. He used CPAP greater than 4 hours 15 of the last 30 days for compliance of 50%. Average usage was 4 hours and 25 minutes. AHI was 0.6 on 11 cm of water and an EPR of 1. There was no significant leak noted.  HISTORY: (copied fromCarolyn Brennan'snote on 03/12/2018)  UPDATE2/24/2020CMMr. Brennan,69 year old male returns for follow-up with newly diagnosed obstructive sleep apnea here for initial CPAP compliance. He is still getting adjusted to his machine but he has good compliance. Data dated 02/10/2018-03/11/2018 shows compliance greater than 4 hours at 93%. Average usage 5 hours 29 minutes. Set pressure 11 cm. EPR level 1 AHI 0.7. He returns for reevaluation  9/16/19[name] is a  69 year old right-handed gentleman with an underlying medical history of hypertension, reflux disease, gout, left eye injury in the past, mitral regurgitation and mild diastolic dysfunction, vitamin D deficiency and overweight state, who reports snoring and excessive daytime somnolence as well and has difficulty maintaining sleep. I reviewed your office note from 06/20/2017, which you kindly included. His Epworth sleepiness score is 9 out of 24, fatigue score is 17 out of 63. He lives alone, is currently separated. He has 6 daughters. 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 He has a bedtime of 11 PM  or midnight. He falls asleep fairly quickly but uses an over-the-counter sleep aid called Power sleep p.m. He takes 2 pills each night. Rise time is around 5:30 or 6. He has nocturia about once for average night, typically around 4 AM, he denies morning headaches. He was told in the past that he had a heart murmur. He has not been told that he has irregular heartbeat. He quit smoking over 15 years ago. He drinks alcohol occasionally, maybe once on the weekends, drinks caffeine in the form of coffee, less than one cup per day on average, more consistently in the wintertime    REVIEW OF SYSTEMS: Out of a complete 14 system review of symptoms, the patient complains only of the following symptoms, none and all other reviewed systems are negative.   ALLERGIES: Allergies  Allergen Reactions  . Trazodone And Nefazodone     Passed out    HOME MEDICATIONS: Outpatient Medications Prior to Visit  Medication Sig Dispense Refill  . allopurinol (ZYLOPRIM) 100 MG tablet Take 100 mg by mouth daily.    Marland Kitchen apixaban (ELIQUIS) 5 MG TABS tablet Take 1 tablet (5 mg total) by mouth 2 (two) times daily. 60 tablet 2  . atorvastatin (LIPITOR) 40 MG tablet Take 40 mg by mouth daily.    . folic acid (FOLVITE) 1 MG tablet Take 1 mg by mouth daily.    . methotrexate (RHEUMATREX) 2.5 MG tablet Take 10 mg by mouth once a week.    . metoprolol tartrate (LOPRESSOR) 25 MG tablet TAKE 1 TABLET BY MOUTH TWICE A DAY 180 tablet 1  . pantoprazole (PROTONIX) 40 MG tablet Take 40 mg by mouth daily.    . prednisoLONE acetate (PRED FORTE) 1 % ophthalmic suspension Place 1 drop into the left eye 3 (three) times daily.     Marland Kitchen telmisartan-hydrochlorothiazide (MICARDIS HCT) 80-12.5 MG tablet Take 1 tablet by mouth daily.     No facility-administered medications prior to visit.    PAST MEDICAL HISTORY: Past Medical History:  Diagnosis Date  . Gout   . Hyperlipidemia   . Hypertension   . Vitamin D deficiency     PAST SURGICAL  HISTORY: Past Surgical History:  Procedure Laterality Date  . EYE SURGERY  1993   due to injury    FAMILY HISTORY: Family History  Problem Relation Age of Onset  . Colon cancer Father   . Diabetes Mother   . Kidney disease Mother   . Arrhythmia Brother   . Hypertension Brother   . Diabetes Brother   . Hypertension Brother   . Cancer Brother     SOCIAL HISTORY: Social History   Socioeconomic History  . Marital status: Legally Separated    Spouse name: Not on file  . Number of children: 6  . Years of education: Not on file  . Highest education level: Not on file  Occupational History  .  Not on file  Tobacco Use  . Smoking status: Former Smoker    Packs/day: 0.25    Years: 20.00    Pack years: 5.00    Types: Cigarettes    Quit date: 02/06/2006    Years since quitting: 13.2  . Smokeless tobacco: Never Used  Substance and Sexual Activity  . Alcohol use: Yes    Comment: 2 times monthly "if that"  . Drug use: No  . Sexual activity: Not on file  Other Topics Concern  . Not on file  Social History Narrative  . Not on file   Social Determinants of Health   Financial Resource Strain:   . Difficulty of Paying Living Expenses:   Food Insecurity:   . Worried About Charity fundraiser in the Last Year:   . Arboriculturist in the Last Year:   Transportation Needs:   . Film/video editor (Medical):   Marland Kitchen Lack of Transportation (Non-Medical):   Physical Activity:   . Days of Exercise per Week:   . Minutes of Exercise per Session:   Stress:   . Feeling of Stress :   Social Connections:   . Frequency of Communication with Friends and Family:   . Frequency of Social Gatherings with Friends and Family:   . Attends Religious Services:   . Active Member of Clubs or Organizations:   . Attends Archivist Meetings:   Marland Kitchen Marital Status:   Intimate Partner Violence:   . Fear of Current or Ex-Partner:   . Emotionally Abused:   Marland Kitchen Physically Abused:   . Sexually  Abused:       PHYSICAL EXAM  Vitals:   05/22/19 1006  BP: (!) 150/70  Weight: 178 lb (80.7 kg)  Height: 5\' 5"  (1.651 m)   Body mass index is 29.62 kg/m.  Generalized: Well developed, in no acute distress  Cardiology: normal rate and rhythm, no murmur noted Respiratory: clear to auscultation bilaterally  Neurological examination  Mentation: Alert oriented to time, place, history taking. Follows all commands speech and language fluent Cranial nerve II-XII: Pupils were equal round reactive to light. Extraocular movements were full, visual field were full  Motor: The motor testing reveals 5 over 5 strength of all 4 extremities. Good symmetric motor tone is noted throughout.  Gait and station: Gait is normal.    DIAGNOSTIC DATA (LABS, IMAGING, TESTING) - I reviewed patient records, labs, notes, testing and imaging myself where available.  No flowsheet data found.   No results found for: WBC, HGB, HCT, MCV, PLT No results found for: NA, K, CL, CO2, GLUCOSE, BUN, CREATININE, CALCIUM, PROT, ALBUMIN, AST, ALT, ALKPHOS, BILITOT, GFRNONAA, GFRAA No results found for: CHOL, HDL, LDLCALC, LDLDIRECT, TRIG, CHOLHDL No results found for: HGBA1C No results found for: VITAMINB12 No results found for: TSH     ASSESSMENT AND PLAN 69 y.o. year old male  has a past medical history of Gout, Hyperlipidemia, Hypertension, and Vitamin D deficiency. here with     ICD-10-CM   1. OSA on CPAP  G47.33    Z99.89     Christos is doing much better with daily and 4-hour compliance.  Compliance report reveals acceptable daily compliance at 83% and 4-hour compliance is 77%.  He was encouraged to continue working on compliance goal of nightly usage and greater than 4 hours each night.  He does feel that the nasal pillow is the most comfortable mask, however, has not tried a chinstrap.  I will  send orders today for supplies and consideration of a chinstrap.  He was advised that he may need to consider a mask  refitting should he continue to have difficulty with dry eyes due to air leak.  Leak is acceptable at 15.2 L/min.  Overall, he feels that he is doing much better.  He will work on healthy lifestyle habits.  He will return to see me in 1 year, sooner if needed.  He verbalizes understanding and agreement with this plan.   No orders of the defined types were placed in this encounter.    No orders of the defined types were placed in this encounter.     I spent 15 minutes with the patient. 50% of this time was spent counseling and educating patient on plan of care and medications.    Shawnie Dapper, FNP-C 05/22/2019, 10:12 AM [facility] [address], [address] [place], [place] [address] [phone]   I reviewed the above note and documentation by the Nurse Practitioner and agree with the history, exam, assessment and plan as outlined above. I was available for consultation. Huston Foley, MD, PhD [facility])

## 2019-05-22 ENCOUNTER — Encounter: Payer: Self-pay | Admitting: Family Medicine

## 2019-05-22 ENCOUNTER — Other Ambulatory Visit: Payer: Self-pay

## 2019-05-22 ENCOUNTER — Ambulatory Visit: Payer: Medicare HMO | Admitting: Family Medicine

## 2019-05-22 VITALS — BP 150/70 | Ht 65.0 in | Wt 178.0 lb

## 2019-05-22 DIAGNOSIS — Z9989 Dependence on other enabling machines and devices: Secondary | ICD-10-CM

## 2019-05-22 DIAGNOSIS — G4733 Obstructive sleep apnea (adult) (pediatric): Secondary | ICD-10-CM

## 2019-05-22 NOTE — Progress Notes (Signed)
Community message sent to [employer]. 

## 2019-05-22 NOTE — Patient Instructions (Signed)
 Please continue using your CPAP regularly. While your insurance requires that you use CPAP at least 4 hours each night on 70% of the nights, I recommend, that you not skip any nights and use it throughout the night if you can. Getting used to CPAP and staying with the treatment long term does take time and patience and discipline. Untreated obstructive sleep apnea when it is moderate to severe can have an adverse impact on cardiovascular health and raise her risk for heart disease, arrhythmias, hypertension, congestive heart failure, stroke and diabetes. Untreated obstructive sleep apnea causes sleep disruption, nonrestorative sleep, and sleep deprivation. This can have an impact on your day to day functioning and cause daytime sleepiness and impairment of cognitive function, memory loss, mood disturbance, and problems focussing. Using CPAP regularly can improve these symptoms.  Follow up in 1 year  CPAP and BPAP Information CPAP and BPAP are methods of helping a person breathe with the use of air pressure. CPAP stands for "continuous positive airway pressure." BPAP stands for "bi-level positive airway pressure." In both methods, air is blown through your nose or mouth and into your air passages to help you breathe well. CPAP and BPAP use different amounts of pressure to blow air. With CPAP, the amount of pressure stays the same while you breathe in and out. With BPAP, the amount of pressure is increased when you breathe in (inhale) so that you can take larger breaths. Your health care provider will recommend whether CPAP or BPAP would be more helpful for you. Why are CPAP and BPAP treatments used? CPAP or BPAP can be helpful if you have:  Sleep apnea.  Chronic obstructive pulmonary disease (COPD).  Heart failure.  Medical conditions that weaken the muscles of the chest including muscular dystrophy, or neurological diseases such as amyotrophic lateral sclerosis (ALS).  Other problems that cause  breathing to be weak, abnormal, or difficult. CPAP is most commonly used for obstructive sleep apnea (OSA) to keep the airways from collapsing when the muscles relax during sleep. How is CPAP or BPAP administered? Both CPAP and BPAP are provided by a small machine with a flexible plastic tube that attaches to a plastic mask. You wear the mask. Air is blown through the mask into your nose or mouth. The amount of pressure that is used to blow the air can be adjusted on the machine. Your health care provider will determine the pressure setting that should be used based on your individual needs. When should CPAP or BPAP be used? In most cases, the mask only needs to be worn during sleep. Generally, the mask needs to be worn throughout the night and during any daytime naps. People with certain medical conditions may also need to wear the mask at other times when they are awake. Follow instructions from your health care provider about when to use the machine. What are some tips for using the mask?   Because the mask needs to be snug, some people feel trapped or closed-in (claustrophobic) when first using the mask. If you feel this way, you may need to get used to the mask. One way to do this is by holding the mask loosely over your nose or mouth and then gradually applying the mask more snugly. You can also gradually increase the amount of time that you use the mask.  Masks are available in various types and sizes. Some fit over your mouth and nose while others fit over just your nose. If your mask does   not fit well, talk with your health care provider about getting a different one.  If you are using a mask that fits over your nose and you tend to breathe through your mouth, a chin strap may be applied to help keep your mouth closed.  The CPAP and BPAP machines have alarms that may sound if the mask comes off or develops a leak.  If you have trouble with the mask, it is very important that you talk with  your health care provider about finding a way to make the mask easier to tolerate. Do not stop using the mask. Stopping the use of the mask could have a negative impact on your health. What are some tips for using the machine?  Place your CPAP or BPAP machine on a secure table or stand near an electrical outlet.  Know where the on/off switch is located on the machine.  Follow instructions from your health care provider about how to set the pressure on your machine and when you should use it.  Do not eat or drink while the CPAP or BPAP machine is on. Food or fluids could get pushed into your lungs by the pressure of the CPAP or BPAP.  Do not smoke. Tobacco smoke residue can damage the machine.  For home use, CPAP and BPAP machines can be rented or purchased through home health care companies. Many different brands of machines are available. Renting a machine before purchasing may help you find out which particular machine works well for you.  Keep the CPAP or BPAP machine and attachments clean. Ask your health care provider for specific instructions. Get help right away if:  You have redness or open areas around your nose or mouth where the mask fits.  You have trouble using the CPAP or BPAP machine.  You cannot tolerate wearing the CPAP or BPAP mask.  You have pain, discomfort, and bloating in your abdomen. Summary  CPAP and BPAP are methods of helping a person breathe with the use of air pressure.  Both CPAP and BPAP are provided by a small machine with a flexible plastic tube that attaches to a plastic mask.  If you have trouble with the mask, it is very important that you talk with your health care provider about finding a way to make the mask easier to tolerate. This information is not intended to replace advice given to you by your health care provider. Make sure you discuss any questions you have with your health care provider. Document Revised: 04/25/2018 Document Reviewed:  11/23/2015 Elsevier Patient Education  2020 Else[employer]   Sleep Apnea Sleep apnea affects breathing during sleep. It causes breathing to stop for a short time or to become shallow. It can also increase the risk of:  Heart attack.  Stroke.  Being very overweight (obese).  Diabetes.  Heart failure.  Irregular heartbeat. The goal of treatment is to help you breathe normally again. What are the causes? There are three kinds of sleep apnea:  Obstructive sleep apnea. This is caused by a blocked or collapsed airway.  Central sleep apnea. This happens when the brain does not send the right signals to the muscles that control breathing.  Mixed sleep apnea. This is a combination of obstructive and central sleep apnea. The most common cause of this condition is a collapsed or blocked airway. This can happen if:  Your throat muscles are too relaxed.  Your tongue and tonsils are too large.  You are overweight.  Your   airway is too small. What increases the risk?  Being overweight.  Smoking.  Having a small airway.  Being older.  Being male.  Drinking alcohol.  Taking medicines to calm yourself (sedatives or tranquilizers).  Having family members with the condition. What are the signs or symptoms?  Trouble staying asleep.  Being sleepy or tired during the day.  Getting angry a lot.  Loud snoring.  Headaches in the morning.  Not being able to focus your mind (concentrate).  Forgetting things.  Less interest in sex.  Mood swings.  Personality changes.  Feelings of sadness (depression).  Waking up a lot during the night to pee (urinate).  Dry mouth.  Sore throat. How is this diagnosed?  Your medical history.  A physical exam.  A test that is done when you are sleeping (sleep study). The test is most often done in a sleep lab but may also be done at home. How is this treated?   Sleeping on your side.  Using a medicine to get rid of mucus  in your nose (decongestant).  Avoiding the use of alcohol, medicines to help you relax, or certain pain medicines (narcotics).  Losing weight, if needed.  Changing your diet.  Not smoking.  Using a machine to open your airway while you sleep, such as: ? An oral appliance. This is a mouthpiece that shifts your lower jaw forward. ? A CPAP device. This device blows air through a mask when you breathe out (exhale). ? An EPAP device. This has valves that you put in each nostril. ? A BPAP device. This device blows air through a mask when you breathe in (inhale) and breathe out.  Having surgery if other treatments do not work. It is important to get treatment for sleep apnea. Without treatment, it can lead to:  High blood pressure.  Coronary artery disease.  In men, not being able to have an erection (impotence).  Reduced thinking ability. Follow these instructions at home: Lifestyle  Make changes that your doctor recommends.  Eat a healthy diet.  Lose weight if needed.  Avoid alcohol, medicines to help you relax, and some pain medicines.  Do not use any products that contain nicotine or tobacco, such as cigarettes, e-cigarettes, and chewing tobacco. If you need help quitting, ask your doctor. General instructions  Take over-the-counter and prescription medicines only as told by your doctor.  If you were given a machine to use while you sleep, use it only as told by your doctor.  If you are having surgery, make sure to tell your doctor you have sleep apnea. You may need to bring your device with you.  Keep all follow-up visits as told by your doctor. This is important. Contact a doctor if:  The machine that you were given to use during sleep bothers you or does not seem to be working.  You do not get better.  You get worse. Get help right away if:  Your chest hurts.  You have trouble breathing in enough air.  You have an uncomfortable feeling in your back, arms,  or stomach.  You have trouble talking.  One side of your body feels weak.  A part of your face is hanging down. These symptoms may be an emergency. Do not wait to see if the symptoms will go away. Get medical help right away. Call your local emergency services (911 in the [place] Do not drive yourself to the hospital. Summary  This condition affects breathing during sleep.    The most common cause is a collapsed or blocked airway.  The goal of treatment is to help you breathe normally while you sleep. This information is not intended to replace advice given to you by your health care provider. Make sure you discuss any questions you have with your health care provider. Document Revised: 10/20/2017 Document Reviewed: 08/29/2017 Elsevier Patient Education  2020 Elsevier Inc.  

## 2019-06-05 DIAGNOSIS — G4733 Obstructive sleep apnea (adult) (pediatric): Secondary | ICD-10-CM | POA: Diagnosis not present

## 2019-06-07 DIAGNOSIS — H3581 Retinal edema: Secondary | ICD-10-CM | POA: Diagnosis not present

## 2019-06-07 DIAGNOSIS — H2702 Aphakia, left eye: Secondary | ICD-10-CM | POA: Diagnosis not present

## 2019-06-07 DIAGNOSIS — H44113 Panuveitis, bilateral: Secondary | ICD-10-CM | POA: Diagnosis not present

## 2019-06-07 DIAGNOSIS — Z79899 Other long term (current) drug therapy: Secondary | ICD-10-CM | POA: Diagnosis not present

## 2019-06-07 DIAGNOSIS — H30033 Focal chorioretinal inflammation, peripheral, bilateral: Secondary | ICD-10-CM | POA: Diagnosis not present

## 2019-06-18 ENCOUNTER — Telehealth: Payer: Self-pay | Admitting: Neurology

## 2019-06-18 ENCOUNTER — Telehealth: Payer: Self-pay

## 2019-06-18 NOTE — Telephone Encounter (Signed)
Patient dropped off a "Cardiac Condition Check List" that needs your signature. I let patient know that you were not in office today but would be available to sign if you see fit tomorrow 6/2. Will provide paperwork tomorrow. Thanks!

## 2019-06-18 NOTE — Telephone Encounter (Signed)
Patient walked into the lobby today requesting a CPAP compliance report for his primary doctor, Dr. Nicholos Johns. Patient's best call back is [phone]

## 2019-06-18 NOTE — Telephone Encounter (Signed)
I reached out to the pt and advised that we could send the report to his MD. He sts the report does not need to be sent to his PCP but needs to be sent to his [employer] ( employee health) MD Dr. Theresia Lo.  Fax # [phone]. Fax has been sent.

## 2019-06-19 NOTE — Telephone Encounter (Signed)
Ok to drive 

## 2019-06-26 DIAGNOSIS — H44113 Panuveitis, bilateral: Secondary | ICD-10-CM | POA: Diagnosis not present

## 2019-06-26 DIAGNOSIS — Z79899 Other long term (current) drug therapy: Secondary | ICD-10-CM | POA: Diagnosis not present

## 2019-06-26 DIAGNOSIS — H30033 Focal chorioretinal inflammation, peripheral, bilateral: Secondary | ICD-10-CM | POA: Diagnosis not present

## 2019-07-06 DIAGNOSIS — G4733 Obstructive sleep apnea (adult) (pediatric): Secondary | ICD-10-CM | POA: Diagnosis not present

## 2019-07-09 ENCOUNTER — Telehealth: Payer: Self-pay | Admitting: Family Medicine

## 2019-07-09 NOTE — Telephone Encounter (Signed)
cpap DL report on desk for review, then will fax.

## 2019-07-09 NOTE — Telephone Encounter (Signed)
Patient has acceptable compliance and apnea is well controlled. Please fax. TY!

## 2019-07-09 NOTE — Telephone Encounter (Signed)
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 Fax # is [phone]. Pt would also like a call when this is done at [phone].

## 2019-07-09 NOTE — Telephone Encounter (Signed)
Fax confirmation received [phone] with cpap DL report.  Pt made aware.

## 2019-08-05 ENCOUNTER — Other Ambulatory Visit: Payer: Self-pay

## 2019-08-05 ENCOUNTER — Telehealth: Payer: Self-pay

## 2019-08-05 DIAGNOSIS — G4733 Obstructive sleep apnea (adult) (pediatric): Secondary | ICD-10-CM | POA: Diagnosis not present

## 2019-08-05 MED ORDER — RIVAROXABAN 20 MG PO TABS: 20.0000 mg | ORAL_TABLET | Freq: Every day | ORAL | 3 refills | Status: DC

## 2019-08-05 NOTE — Telephone Encounter (Signed)
We can try Xarelto 20 mg daily if it is any cheaper. Please change the prescription. Please offer samples. If that is also expensive, only option is warfarin.   Thanks MJP

## 2019-08-05 NOTE — Telephone Encounter (Signed)
Telephone encounter:  Reason for call: pt cannot afford eliquis. We have no samples. He asked for something more affordable. He does not qualify for assistance.   Usual provider: [facility]  Last office visit: 05/08/19  Next office visit: 11/07/19   Last hospitalization: NA   Current Outpatient Medications on File Prior to Visit  Medication Sig Dispense Refill  . allopurinol (ZYLOPRIM) 100 MG tablet Take 100 mg by mouth daily.    Marland Kitchen apixaban (ELIQUIS) 5 MG TABS tablet Take 1 tablet (5 mg total) by mouth 2 (two) times daily. 60 tablet 2  . atorvastatin (LIPITOR) 40 MG tablet Take 40 mg by mouth daily.    . folic acid (FOLVITE) 1 MG tablet Take 1 mg by mouth daily.    . methotrexate (RHEUMATREX) 2.5 MG tablet Take 10 mg by mouth once a week.    . metoprolol tartrate (LOPRESSOR) 25 MG tablet TAKE 1 TABLET BY MOUTH TWICE A DAY 180 tablet 1  . pantoprazole (PROTONIX) 40 MG tablet Take 40 mg by mouth daily.    . prednisoLONE acetate (PRED FORTE) 1 % ophthalmic suspension Place 1 drop into the left eye 3 (three) times daily.     Marland Kitchen telmisartan-hydrochlorothiazide (MICARDIS HCT) 80-12.5 MG tablet Take 1 tablet by mouth daily.     No current facility-administered medications on file prior to visit.

## 2019-08-06 ENCOUNTER — Telehealth: Payer: Self-pay

## 2019-08-06 ENCOUNTER — Other Ambulatory Visit: Payer: Self-pay | Admitting: Pharmacist

## 2019-08-06 DIAGNOSIS — I480 Paroxysmal atrial fibrillation: Secondary | ICD-10-CM

## 2019-08-06 MED ORDER — WARFARIN SODIUM 5 MG PO TABS: 5.0000 mg | ORAL_TABLET | Freq: Every day | ORAL | 0 refills | Status: DC

## 2019-08-06 NOTE — Telephone Encounter (Signed)
Only other option is warfarin. Let know if patient would like to switch.  Teny, could you take over his warfarin management for PAF?  Thanks MJP

## 2019-08-06 NOTE — Telephone Encounter (Signed)
Called and reviewed with pt. Pt states that the pharmacy quoted copayment of $76 for the [facility]. Pt unsure of how many tablets of the eliquis he has left at home. Pt to call back tomorrow to verify his current home anticoagulant therapy and quantity. Pended warfarin 5 mg for Dr. Rosemary Holms.   Provided patient with verbal information regarding purpose, proper use, and potential adverse effects of warfarin. Included information regarding drug interactions, dietary interactions, need to avoid or limit alcohol intake, necessary INR monitoring, and reporting any unexpected bleeding. Patient described understanding.  Will follow up with pt tomorrow regarding scheduling follow up INR check

## 2019-08-06 NOTE — Progress Notes (Addendum)
Called and reviewed with pt. Pt states that the pharmacy quoted copayment of $76 for the [facility]. Pt unwilling/unable to pay for Xeralto. Pt unsure of how many tablets of his previous Eliquis he has left at home. Pt unsure if he recently ran out of his home dose or if he is still taking it BID as directed. Pt to call back tomorrow to verify his current home anticoagulant therapy and quantity of remaining tablets. Pended warfarin 5 mg for Dr. Rosemary Holms. Pt aware to call the office tomorrow before starting warfarin. Will bridge warfarin with eliquis. Will ideally continue eliquis with warfarin for 3 days before stopping home eliquis.   Provided patient with verbal information regarding purpose, proper use, and potential adverse effects of warfarin. Included information regarding drug interactions, dietary interactions, need to avoid or limit alcohol intake, necessary INR monitoring, and reporting any unexpected bleeding. Patient described understanding.

## 2019-08-06 NOTE — Telephone Encounter (Signed)
Pt called to inform us that he is not able to afford Xarelto nor eliquis please advise thank you.

## 2019-08-08 ENCOUNTER — Telehealth: Payer: Self-pay

## 2019-08-08 NOTE — Telephone Encounter (Signed)
Called and reviewed with pt. Pt stated that his last dose of Eliquis was on Saturday. Pt picked up warfarin 5 mg yesterday and hasnt started taking it yet. [id number] score = 2 (HTN hx, Age 69-74), HAS-BLED = 1 (Age>65). Instructed pt to start warfarin 5 mg today. Scheduled f/u INR check on 08/12/19. Instructed pt to keep a food diary to review at next INR check. Reviewed bleeding s/sx to monitor and when to reach out to the clinic vs EMS.

## 2019-08-08 NOTE — Telephone Encounter (Signed)
Patient has questions about how take Warfarin Rx he just picked up and how the monitoring of levels will be handled. Per notes in chart he was to call back and let us know if he had any Eliquis at home. Patient states he does not. He thought also that he was supposed to stop taking Metoprolol. I did not see that noted in the chart.

## 2019-08-12 ENCOUNTER — Ambulatory Visit: Payer: Medicare HMO | Admitting: Pharmacist

## 2019-08-12 ENCOUNTER — Other Ambulatory Visit: Payer: Self-pay

## 2019-08-12 DIAGNOSIS — Z7901 Long term (current) use of anticoagulants: Secondary | ICD-10-CM | POA: Diagnosis not present

## 2019-08-12 DIAGNOSIS — I480 Paroxysmal atrial fibrillation: Secondary | ICD-10-CM

## 2019-08-12 DIAGNOSIS — Z5181 Encounter for therapeutic drug level monitoring: Secondary | ICD-10-CM | POA: Diagnosis not present

## 2019-08-12 LAB — POCT INR: INR: 1.6 — AB (ref 2.0–3.0)

## 2019-08-12 NOTE — Patient Instructions (Addendum)
INSTRUCTIONS: INR below goal. Increase weekly dose to 7.5 mg every Mon and Wed and 5 mg all other days. Recheck INR in 4 days.   Warfarin tablets What is this medicine? WARFARIN (WAR far in) is an anticoagulant. It is used to treat or prevent clots in the veins, arteries, lungs, or heart. This medicine may be used for other purposes; ask your health care provider or pharmacist if you have questions. COMMON BRAND NAME(S): Coumadin, Jantoven What should I tell my health care provider before I take this medicine? They need to know if you have any of these conditions:  alcoholism  anemia  bleeding disorders  cancer  diabetes  heart disease  high blood pressure  history of bleeding in the gastrointestinal tract  history of stroke or other brain injury or disease  kidney or liver disease  protein C deficiency  protein S deficiency  psychosis or dementia  recent injury, recent or planned surgery or procedure  an unusual or allergic reaction to warfarin, other medicines, foods, dyes, or preservatives  pregnant or trying to get pregnant  breast-feeding How should I use this medicine? Take this medicine by mouth with a glass of water. Follow the directions on the prescription label. You can take this medicine with or without food. Take your medicine at the same time each day. Do not take it more often than directed. Do not stop taking except on your doctor's advice. Stopping this medicine may increase your risk of a blood clot. Be sure to refill your prescription before you run out of medicine. If your doctor or healthcare professional calls to change your dose, write down the dose and any other instructions. Always read the dose and instructions back to him or her to make sure you understand them. Tell your doctor or healthcare professional what strength of tablets you have on hand. Ask how many tablets you should take to equal your new dose. Write the date on the new instructions  and keep them near your medicine. If you are told to stop taking your medicine until your next blood test, call your doctor or healthcare professional if you do not hear anything within 24 hours of the test to find out your new dose or when to restart your prior dose. A special MedGuide will be given to you by the pharmacist with each prescription and refill. Be sure to read this information carefully each time. Talk to your pediatrician regarding the use of this medicine in children. Special care may be needed. Overdosage: If you think you have taken too much of this medicine contact a poison control center or emergency room at once. NOTE: This medicine is only for you. Do not share this medicine with others. What if I miss a dose? It is important not to miss a dose. If you miss a dose, call your healthcare provider. Take the dose as soon as possible on the same day. If it is almost time for your next dose, take only that dose. Do not take double or extra doses to make up for a missed dose. What may interact with this medicine? Do not take this medicine with any of the following medications:  agents that prevent or dissolve blood clots  aspirin or other salicylates  danshen  dextrothyroxine  mifepristone  St. John's Wort  red yeast rice This medicine may also interact with the following medications:  acetaminophen  agents that lower cholesterol  alcohol  allopurinol  amiodarone  antibiotics or medicines for  treating bacterial, fungal or viral infections  azathioprine  barbiturate medicines for inducing sleep or treating seizures  certain medicines for diabetes  certain medicines for heart rhythm problems  certain medicines for hepatitis C virus infections like daclatasvir, dasabuvir; ombitasvir; paritaprevir; ritonavir, elbasvir; grazoprevir, ledipasvir; sofosbuvir, simeprevir, sofosbuvir, sofosbuvir; velpatasvir, sofosbuvir; velpatasvir; voxilaprevir  certain  medicines for high blood pressure  chloral hydrate  cisapride  conivaptan  disulfiram  male hormones, including contraceptive or birth control pills  general anesthetics  herbal or dietary products like garlic, ginkgo, ginseng, green tea, or kava kava  influenza virus vaccine  male hormones  medicines for mental depression or psychosis  medicines for some types of cancer  medicines for stomach problems  methylphenidate  NSAIDs, medicines for pain and inflammation, like ibuprofen or naproxen  propoxyphene  quinidine, quinine  raloxifene  seizure or epilepsy medicine like carbamazepine, phenytoin, and valproic acid  steroids like cortisone and prednisone  tamoxifen  thyroid medicine  tramadol  vitamin c, vitamin e, and vitamin K  zafirlukast  zileuton This list may not describe all possible interactions. Give your health care provider a list of all the medicines, herbs, non-prescription drugs, or dietary supplements you use. Also tell them if you smoke, drink alcohol, or use illegal drugs. Some items may interact with your medicine. What should I watch for while using this medicine? Visit your healthcare professional for regular checks on your progress. You will need to have a blood test called a PT/INR regularly. The PT/INR blood test is done to make sure you are getting the right dose of this medicine. It is important to not miss your appointment for the blood tests. When you first start taking this medicine, these tests are done often. Once the correct dose is determined and you take your medicine properly, these tests can be done less often. Wear a medical ID bracelet or chain, and carry a card that describes your disease and details of your medicine and dosage times. Do not start taking or stop taking any medicines or over-the-counter medicines except on the advice of your healthcare professional. You should discuss your diet with your healthcare  professional. Do not make major changes in your diet. Vitamin K can affect how well this medicine works. Many foods contain vitamin K. It is important to eat a consistent amount of foods with vitamin K. Other foods with vitamin K that you should eat in consistent amounts are asparagus, basil, black-eyed peas, broccoli, brussel sprouts, cabbage, green onions, green tea, parsley, green leafy vegetables like beet greens, collard greens, kale, spinach, turnip greens, or certain lettuces like green leaf or romaine. This medicine can cause birth defects or bleeding in an unborn child. Women of childbearing age should use effective birth control while taking this medicine. If a woman becomes pregnant while taking this medicine, she should discuss the potential risks and her options with her healthcare professional. Avoid sports and activities that might cause injury while you are using this medicine. Severe falls or injuries can cause unseen bleeding. Be careful when using sharp tools or knives. Consider using an Neurosurgeon. Take special care brushing or flossing your teeth. Report any injuries, bruising, or red spots on the skin to your healthcare professional. If you have an illness that causes vomiting, diarrhea, or fever for more than a few days, contact your health care professional. Also, check with your healthcare professional if you are unable to eat for several days. These problems can change the effect of this  medicine. Even after you stop taking this medicine, it takes several days before your body recovers its normal ability to clot blood. Ask your healthcare professional how long you need to be careful. If you are going to have surgery or dental work, tell your health care professional that you have been taking this medicine. What side effects may I notice from receiving this medicine? Side effects that you should report to your doctor or health care professional as soon as possible:  allergic  reactions like skin rash, itching or hives, swelling of the face, lips, or tongue  heavy menstrual bleeding or vaginal bleeding  painful, blue or purple toes  painful skin ulcers that do not go away  signs and symptoms of bleeding such as bloody or black, tarry stools; red or dark-brown urine; spitting up blood or brown material that looks like coffee grounds; red spots on the skin; unusual bruising or bleeding from the eye, gums, or nose  signs and symptoms of a blood clot such as chest pain; shortness of breath; pain, swelling, or warmth in the leg  signs and symptoms of a stroke such as changes in vision; confusion; trouble speaking or understanding; severe headaches; sudden numbness or weakness of the face, arm or leg; trouble walking; dizziness; loss of coordination  stomach pain  unusually weak or tired Side effects that usually do not require medical attention (report to your doctor or health care professional if they continue or are bothersome):  diarrhea  hair loss This list may not describe all possible side effects. Call your doctor for medical advice about side effects. You may report side effects to FDA at 1-800-FDA-1088.   Vitamin K Foods and Warfarin Warfarin is a blood thinner (anticoagulant). Anticoagulant medicines help prevent the formation of blood clots. These medicines work by decreasing the activity of vitamin K, which promotes normal blood clotting. When you take warfarin, problems can occur from suddenly increasing or decreasing the amount of vitamin K that you eat from one day to the next. Problems may include:  Blood clots.  Bleeding. What general guidelines do I need to follow? To avoid problems when taking warfarin:  Eat a balanced diet that includes: ? Fresh fruits and vegetables. ? Whole grains. ? Low-fat dairy products. ? Lean proteins, such as fish, eggs, and lean cuts of meat.  Keep your intake of vitamin K consistent from day to day. To do  this: ? Avoid eating large amounts of vitamin K one day and low amounts of vitamin K the next day. ? If you take a multivitamin that contains vitamin K, be sure to take it every day. ? Know which foods contain vitamin K. Use the lists below to understand serving sizes and the amount of vitamin K in one serving.  Avoid major changes in your diet. If you are going to change your diet, talk with your health care provider before making changes.  Work with a Dealernutrition specialist (dietitian) to develop a meal plan that works best for you.  High vitamin K foods Foods that are high in vitamin K contain more than 100 mcg (micrograms) per serving. These include:  Broccoli (cooked) -  cup has 110 mcg.  Brussels sprouts (cooked) -  cup has 109 mcg.  Greens, beet (cooked) -  cup has 350 mcg.  Greens, collard (cooked) -  cup has 418 mcg.  Greens, turnip (cooked) -  cup has 265 mcg.  Green onions or scallions -  cup has 105 mcg.  Kale (fresh or frozen) -  cup has 531 mcg.  Parsley (raw) - 10 sprigs has 164 mcg.  Spinach (cooked) -  cup has 444 mcg.  Swiss chard (cooked) -  cup has 287 mcg. Moderate vitamin K foods Foods that have a moderate amount of vitamin K contain 25-100 mcg per serving. These include:  Asparagus (cooked) - 5 spears have 38 mcg.  Black-eyed peas (dried) -  cup has 32 mcg.  Cabbage (cooked) -  cup has 37 mcg.  Kiwi fruit - 1 medium has 31 mcg.  Lettuce - 1 cup has 57-63 mcg.  Okra (frozen) -  cup has 44 mcg.  Prunes (dried) - 5 prunes have 25 mcg.  Watercress (raw) - 1 cup has 85 mcg. Low vitamin K foods Foods low in vitamin K contain less than 25 mcg per serving. These include:  Artichoke - 1 medium has 18 mcg.  Avocado - 1 oz. has 6 mcg.  Blueberries -  cup has 14 mcg.  Cabbage (raw) -  cup has 21 mcg.  Carrots (cooked) -  cup has 11 mcg.  Cauliflower (raw) -  cup has 11 mcg.  Cucumber with peel (raw) -  cup has 9 mcg.  Grapes  -  cup has 12 mcg.  Mango - 1 medium has 9 mcg.  Nuts - 1 oz. has 15 mcg.  Pear - 1 medium has 8 mcg.  Peas (cooked) -  cup has 19 mcg.  Pickles - 1 spear has 14 mcg.  Pumpkin seeds - 1 oz. has 13 mcg.  Sauerkraut (canned) -  cup has 16 mcg.  Soybeans (cooked) -  cup has 16 mcg.  Tomato (raw) - 1 medium has 10 mcg.  Tomato sauce -  cup has 17 mcg. Vitamin K-free foods If a food contain less than 5 mcg per serving, it is considered to have no vitamin K. These foods include:  Bread and cereal products.  Cheese.  Eggs.  Fish and shellfish.  Meat and poultry.  Milk and dairy products.  Sunflower seeds. Actual amounts of vitamin K in foods may be different depending on processing. Talk with your dietitian about what foods you can eat and what foods you should avoid. This information is not intended to replace advice given to you by your health care provider. Make sure you discuss any questions you have with your health care provider. Document Revised: 12/16/2016 Document Reviewed: 04/08/2015 Elsevier Patient Education  2020 [employer].  Where should I keep my medicine? Keep out of the reach of children. Store at room temperature between 15 and 30 degrees C (59 and 86 degrees F). Protect from light. Throw away any unused medicine after the expiration date. Do not flush down the toilet. NOTE: This sheet is a summary. It may not cover all possible information. If you have questions about this medicine, talk to your doctor, pharmacist, or health care provider.  2020 Elsevier/Gold Standard (2016-10-19 13:06:45)

## 2019-08-12 NOTE — Progress Notes (Signed)
Anticoagulation Management Steven Brennan is a 69 y.o. male who reports to the clinic for monitoring of warfarin treatment.    Indication: atrial fibrillation CHA2DS2 Vasc Score 2 (HTN Hx, Age 42-74), HAS-BLED 1 (Age >58)  Duration: indefinite Supervising physician: Manish Patwardhan  Anticoagulation Clinic Visit History:  New start warfarin pt. Pt was previously on Eliquis samples but was unable to continue affording Eliquis from his pharmacy. Pt was switched to [place], which was also noted to be unaffordable for the pt. Last dose of Eliquis on 08/03/19. Pt started on warfarin 5 mg daily on 08/08/19. Pt reports to be tolerating warfarin well without any ADRs. Med list reviewed and updated. Noted potential DDI b/w Allopurinol and Warfarin. Reviewed avoidance of NSAID containing products and the increased risk of GI bleed with prolonged use. Pt also attests to chronic alcohol use, pt pt usually binging over the weekend. Pt states that "he doesn't count how many he has" during such episodes. Pt reports that he has been trying to cut down his alcohol binging over the past couple of weeks. Reviewed the potential ADRs and DDI with alcohol. Pt aware, but is still planning on continuing his limited alcohol intake. Reviewed Vit-K rich food and need for maintaining steady intake while on Warfarin. Reviewed pt's current diet and pt denies any intake of Vit-K rich foods. Diet mostly consists of cereals, crackers, baloney, eggs, watermelon, and other dried meat products.   Recent complains of drowsiness and constipation since starting warfarin. Chronic constipation complains which pt manges using metamucil and oral colace. Pt concerned about drowsiness symptoms. 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 Pt reports to having prior concerns of sleep disturbance. Followed by neurology and is being managed with CPAP therapy at home.   Patient does not report signs/symptoms  of bleeding or thromboembolism.  Other recent changes: No change in diet, medications, lifestyle.   Anticoagulation Episode Summary    Current INR goal:  2.0-3.0  TTR:  --  Next INR check:  08/16/2019  INR from last check:  1.6 (08/12/2019)  Weekly max warfarin dose:  35 mg  Target end date:  Indefinite  INR check location:  Anticoagulation Clinic  Preferred lab:    Send INR reminders to:     Indications   Paroxysmal atrial fibrillation (HCC) [I48.0] Monitoring for long-term anticoagulant use [Z51.81 Z79.01]       Comments:        Anticoagulation Care Providers    Provider Role Specialty Phone number   Elder Negus, MD Referring Cardiology [phone]      Allergies  Allergen Reactions  . Trazodone And Nefazodone     Passed out    Current Outpatient Medications:  .  acetaminophen (TYLENOL) 325 MG tablet, Take 650 mg by mouth every 6 (six) hours as needed., Disp: , Rfl:  .  allopurinol (ZYLOPRIM) 100 MG tablet, Take 100 mg by mouth daily., Disp: , Rfl:  .  atorvastatin (LIPITOR) 40 MG tablet, Take 40 mg by mouth daily., Disp: , Rfl:  .  folic acid (FOLVITE) 1 MG tablet, Take 1 mg by mouth daily. Take 5 tablet, Disp: , Rfl:  .  methotrexate (RHEUMATREX) 2.5 MG tablet, Take 15 mg by mouth once a week. , Disp: , Rfl:  .  metoprolol tartrate (LOPRESSOR) 25 MG tablet, TAKE 1 TABLET BY MOUTH TWICE A DAY, Disp: 180 tablet, Rfl: 1 .  multivitamin-iron-minerals-folic acid (CENTRUM) chewable tablet,  Chew 1 tablet by mouth daily., Disp: , Rfl:  .  pantoprazole (PROTONIX) 40 MG tablet, Take 40 mg by mouth daily., Disp: , Rfl:  .  prednisoLONE acetate (PRED FORTE) 1 % ophthalmic suspension, Place 1 drop into the left eye 3 (three) times daily. , Disp: , Rfl:  .  psyllium (METAMUCIL) 58.6 % packet, Take 1 packet by mouth daily., Disp: , Rfl:  .  telmisartan-hydrochlorothiazide (MICARDIS HCT) 80-12.5 MG tablet, Take 1 tablet by mouth daily., Disp: , Rfl:  .  warfarin (COUMADIN)  5 MG tablet, Take 1 tablet (5 mg total) by mouth daily at 4 PM., Disp: 90 tablet, Rfl: 0 Past Medical History:  Diagnosis Date  . Gout   . Hyperlipidemia   . Hypertension   . Vitamin D deficiency     ASSESSMENT  Recent Results: The most recent result is correlated with 35 mg per week:  Lab Results  Component Value Date   INR 1.6 (A) 08/12/2019    Anticoagulation Dosing: Description   INR below goal. Increase weekly dose to 7.5 mg every Mon and Wed and 5 mg all other days. Recheck INR in 4 days.       INR today: Subtherapeutic, new start warfarin pt. Pt with chronic alcohol use with may result in increased warfarin metabolism. Will increase weekly dose by 14% and continue close monitoring and follow up. Will review Eliquis Patient Assistance and consider completing needed Patient Assistance paperwork to see if pt is eligible. Pt denies any s/sx of bleeding or bruising. Denies any stroke like symptoms.   PLAN Weekly dose was increased by 14% to 40 mg per week. Increase weekly dose to 7.5 mg every Mon and Wed and 5 mg all other days. Recheck INR in 4 days.   Patient Instructions  INSTRUCTIONS: INR below goal. Increase weekly dose to 7.5 mg every Mon and Wed and 5 mg all other days. Recheck INR in 4 days.   Warfarin tablets What is this medicine? WARFARIN (WAR far in) is an anticoagulant. It is used to treat or prevent clots in the veins, arteries, lungs, or heart. This medicine may be used for other purposes; ask your health care provider or pharmacist if you have questions. COMMON BRAND NAME(S): Coumadin, Jantoven What should I tell my health care provider before I take this medicine? They need to know if you have any of these conditions:  alcoholism  anemia  bleeding disorders  cancer  diabetes  heart disease  high blood pressure  history of bleeding in the gastrointestinal tract  history of stroke or other brain injury or disease  kidney or liver  disease  protein C deficiency  protein S deficiency  psychosis or dementia  recent injury, recent or planned surgery or procedure  an unusual or allergic reaction to warfarin, other medicines, foods, dyes, or preservatives  pregnant or trying to get pregnant  breast-feeding How should I use this medicine? Take this medicine by mouth with a glass of water. Follow the directions on the prescription label. You can take this medicine with or without food. Take your medicine at the same time each day. Do not take it more often than directed. Do not stop taking except on your doctor's advice. Stopping this medicine may increase your risk of a blood clot. Be sure to refill your prescription before you run out of medicine. If your doctor or healthcare professional calls to change your dose, write down the dose and any other instructions. Always read  the dose and instructions back to him or her to make sure you understand them. Tell your doctor or healthcare professional what strength of tablets you have on hand. Ask how many tablets you should take to equal your new dose. Write the date on the new instructions and keep them near your medicine. If you are told to stop taking your medicine until your next blood test, call your doctor or healthcare professional if you do not hear anything within 24 hours of the test to find out your new dose or when to restart your prior dose. A special MedGuide will be given to you by the pharmacist with each prescription and refill. Be sure to read this information carefully each time. Talk to your pediatrician regarding the use of this medicine in children. Special care may be needed. Overdosage: If you think you have taken too much of this medicine contact a poison control center or emergency room at once. NOTE: This medicine is only for you. Do not share this medicine with others. What if I miss a dose? It is important not to miss a dose. If you miss a dose, call  your healthcare provider. Take the dose as soon as possible on the same day. If it is almost time for your next dose, take only that dose. Do not take double or extra doses to make up for a missed dose. What may interact with this medicine? Do not take this medicine with any of the following medications:  agents that prevent or dissolve blood clots  aspirin or other salicylates  danshen  dextrothyroxine  mifepristone  St. John's Wort  red yeast rice This medicine may also interact with the following medications:  acetaminophen  agents that lower cholesterol  alcohol  allopurinol  amiodarone  antibiotics or medicines for treating bacterial, fungal or viral infections  azathioprine  barbiturate medicines for inducing sleep or treating seizures  certain medicines for diabetes  certain medicines for heart rhythm problems  certain medicines for hepatitis C virus infections like daclatasvir, dasabuvir; ombitasvir; paritaprevir; ritonavir, elbasvir; grazoprevir, ledipasvir; sofosbuvir, simeprevir, sofosbuvir, sofosbuvir; velpatasvir, sofosbuvir; velpatasvir; voxilaprevir  certain medicines for high blood pressure  chloral hydrate  cisapride  conivaptan  disulfiram  male hormones, including contraceptive or birth control pills  general anesthetics  herbal or dietary products like garlic, ginkgo, ginseng, green tea, or kava kava  influenza virus vaccine  male hormones  medicines for mental depression or psychosis  medicines for some types of cancer  medicines for stomach problems  methylphenidate  NSAIDs, medicines for pain and inflammation, like ibuprofen or naproxen  propoxyphene  quinidine, quinine  raloxifene  seizure or epilepsy medicine like carbamazepine, phenytoin, and valproic acid  steroids like cortisone and prednisone  tamoxifen  thyroid medicine  tramadol  vitamin c, vitamin e, and vitamin K  zafirlukast  zileuton This  list may not describe all possible interactions. Give your health care provider a list of all the medicines, herbs, non-prescription drugs, or dietary supplements you use. Also tell them if you smoke, drink alcohol, or use illegal drugs. Some items may interact with your medicine. What should I watch for while using this medicine? Visit your healthcare professional for regular checks on your progress. You will need to have a blood test called a PT/INR regularly. The PT/INR blood test is done to make sure you are getting the right dose of this medicine. It is important to not miss your appointment for the blood tests. When you first start  taking this medicine, these tests are done often. Once the correct dose is determined and you take your medicine properly, these tests can be done less often. Wear a medical ID bracelet or chain, and carry a card that describes your disease and details of your medicine and dosage times. Do not start taking or stop taking any medicines or over-the-counter medicines except on the advice of your healthcare professional. You should discuss your diet with your healthcare professional. Do not make major changes in your diet. Vitamin K can affect how well this medicine works. Many foods contain vitamin K. It is important to eat a consistent amount of foods with vitamin K. Other foods with vitamin K that you should eat in consistent amounts are asparagus, basil, black-eyed peas, broccoli, brussel sprouts, cabbage, green onions, green tea, parsley, green leafy vegetables like beet greens, collard greens, kale, spinach, turnip greens, or certain lettuces like green leaf or romaine. This medicine can cause birth defects or bleeding in an unborn child. Women of childbearing age should use effective birth control while taking this medicine. If a woman becomes pregnant while taking this medicine, she should discuss the potential risks and her options with her healthcare professional. Avoid  sports and activities that might cause injury while you are using this medicine. Severe falls or injuries can cause unseen bleeding. Be careful when using sharp tools or knives. Consider using an Neurosurgeon. Take special care brushing or flossing your teeth. Report any injuries, bruising, or red spots on the skin to your healthcare professional. If you have an illness that causes vomiting, diarrhea, or fever for more than a few days, contact your health care professional. Also, check with your healthcare professional if you are unable to eat for several days. These problems can change the effect of this medicine. Even after you stop taking this medicine, it takes several days before your body recovers its normal ability to clot blood. Ask your healthcare professional how long you need to be careful. If you are going to have surgery or dental work, tell your health care professional that you have been taking this medicine. What side effects may I notice from receiving this medicine? Side effects that you should report to your doctor or health care professional as soon as possible:  allergic reactions like skin rash, itching or hives, swelling of the face, lips, or tongue  heavy menstrual bleeding or vaginal bleeding  painful, blue or purple toes  painful skin ulcers that do not go away  signs and symptoms of bleeding such as bloody or black, tarry stools; red or dark-brown urine; spitting up blood or brown material that looks like coffee grounds; red spots on the skin; unusual bruising or bleeding from the eye, gums, or nose  signs and symptoms of a blood clot such as chest pain; shortness of breath; pain, swelling, or warmth in the leg  signs and symptoms of a stroke such as changes in vision; confusion; trouble speaking or understanding; severe headaches; sudden numbness or weakness of the face, arm or leg; trouble walking; dizziness; loss of coordination  stomach pain  unusually weak or  tired Side effects that usually do not require medical attention (report to your doctor or health care professional if they continue or are bothersome):  diarrhea  hair loss This list may not describe all possible side effects. Call your doctor for medical advice about side effects. You may report side effects to FDA at 1-800-FDA-1088.   Vitamin K Foods and  Warfarin Warfarin is a blood thinner (anticoagulant). Anticoagulant medicines help prevent the formation of blood clots. These medicines work by decreasing the activity of vitamin K, which promotes normal blood clotting. When you take warfarin, problems can occur from suddenly increasing or decreasing the amount of vitamin K that you eat from one day to the next. Problems may include:  Blood clots.  Bleeding. What general guidelines do I need to follow? To avoid problems when taking warfarin:  Eat a balanced diet that includes: ? Fresh fruits and vegetables. ? Whole grains. ? Low-fat dairy products. ? Lean proteins, such as fish, eggs, and lean cuts of meat.  Keep your intake of vitamin K consistent from day to day. To do this: ? Avoid eating large amounts of vitamin K one day and low amounts of vitamin K the next day. ? If you take a multivitamin that contains vitamin K, be sure to take it every day. ? Know which foods contain vitamin K. Use the lists below to understand serving sizes and the amount of vitamin K in one serving.  Avoid major changes in your diet. If you are going to change your diet, talk with your health care provider before making changes.  Work with a Dealer (dietitian) to develop a meal plan that works best for you.  High vitamin K foods Foods that are high in vitamin K contain more than 100 mcg (micrograms) per serving. These include:  Broccoli (cooked) -  cup has 110 mcg.  Brussels sprouts (cooked) -  cup has 109 mcg.  Greens, beet (cooked) -  cup has 350 mcg.  Greens, collard  (cooked) -  cup has 418 mcg.  Greens, turnip (cooked) -  cup has 265 mcg.  Green onions or scallions -  cup has 105 mcg.  Kale (fresh or frozen) -  cup has 531 mcg.  Parsley (raw) - 10 sprigs has 164 mcg.  Spinach (cooked) -  cup has 444 mcg.  Swiss chard (cooked) -  cup has 287 mcg. Moderate vitamin K foods Foods that have a moderate amount of vitamin K contain 25-100 mcg per serving. These include:  Asparagus (cooked) - 5 spears have 38 mcg.  Black-eyed peas (dried) -  cup has 32 mcg.  Cabbage (cooked) -  cup has 37 mcg.  Kiwi fruit - 1 medium has 31 mcg.  Lettuce - 1 cup has 57-63 mcg.  Okra (frozen) -  cup has 44 mcg.  Prunes (dried) - 5 prunes have 25 mcg.  Watercress (raw) - 1 cup has 85 mcg. Low vitamin K foods Foods low in vitamin K contain less than 25 mcg per serving. These include:  Artichoke - 1 medium has 18 mcg.  Avocado - 1 oz. has 6 mcg.  Blueberries -  cup has 14 mcg.  Cabbage (raw) -  cup has 21 mcg.  Carrots (cooked) -  cup has 11 mcg.  Cauliflower (raw) -  cup has 11 mcg.  Cucumber with peel (raw) -  cup has 9 mcg.  Grapes -  cup has 12 mcg.  Mango - 1 medium has 9 mcg.  Nuts - 1 oz. has 15 mcg.  Pear - 1 medium has 8 mcg.  Peas (cooked) -  cup has 19 mcg.  Pickles - 1 spear has 14 mcg.  Pumpkin seeds - 1 oz. has 13 mcg.  Sauerkraut (canned) -  cup has 16 mcg.  Soybeans (cooked) -  cup has 16 mcg.  Tomato (raw) -  1 medium has 10 mcg.  Tomato sauce -  cup has 17 mcg. Vitamin K-free foods If a food contain less than 5 mcg per serving, it is considered to have no vitamin K. These foods include:  Bread and cereal products.  Cheese.  Eggs.  Fish and shellfish.  Meat and poultry.  Milk and dairy products.  Sunflower seeds. Actual amounts of vitamin K in foods may be different depending on processing. Talk with your dietitian about what foods you can eat and what foods you should avoid. This  information is not intended to replace advice given to you by your health care provider. Make sure you discuss any questions you have with your health care provider. Document Revised: 12/16/2016 Document Reviewed: 04/08/2015 Elsevier Patient Education  2020 [employer]  Where should I keep my medicine? Keep out of the reach of children. Store at room temperature between 15 and 30 degrees C (59 and 86 degrees F). Protect from light. Throw away any unused medicine after the expiration date. Do not flush down the toilet. NOTE: This sheet is a summary. It may not cover all possible information. If you have questions about this medicine, talk to your doctor, pharmacist, or health care provider.  2020 Elsevier/Gold Standard (2016-10-19 13:06:45)   Patient advised to contact clinic or seek medical attention if signs/symptoms of bleeding or thromboembolism occur.  Patient verbalized understanding by repeating back information and was advised to contact me if further medication-related questions arise.   Follow-up Return in about 4 days (around 08/16/2019).  Leonides Schanzeny T Vadis Slabach, PharmD  15 minutes spent face-to-face with the patient during the encounter. 50% of time spent on education, including signs/sx bleeding and clotting, as well as food and drug interactions with warfarin. 50% of time was spent on fingerprick POC INR sample collection,processing, results determination, and documentation

## 2019-08-16 ENCOUNTER — Other Ambulatory Visit: Payer: Self-pay

## 2019-08-16 ENCOUNTER — Ambulatory Visit: Payer: Medicare HMO | Admitting: Pharmacist

## 2019-08-16 DIAGNOSIS — Z7901 Long term (current) use of anticoagulants: Secondary | ICD-10-CM

## 2019-08-16 DIAGNOSIS — Z5181 Encounter for therapeutic drug level monitoring: Secondary | ICD-10-CM

## 2019-08-16 DIAGNOSIS — H2702 Aphakia, left eye: Secondary | ICD-10-CM | POA: Diagnosis not present

## 2019-08-16 DIAGNOSIS — H3581 Retinal edema: Secondary | ICD-10-CM | POA: Diagnosis not present

## 2019-08-16 DIAGNOSIS — I480 Paroxysmal atrial fibrillation: Secondary | ICD-10-CM

## 2019-08-16 DIAGNOSIS — H30033 Focal chorioretinal inflammation, peripheral, bilateral: Secondary | ICD-10-CM | POA: Diagnosis not present

## 2019-08-16 DIAGNOSIS — H30102 Unspecified disseminated chorioretinal inflammation, left eye: Secondary | ICD-10-CM | POA: Diagnosis not present

## 2019-08-16 DIAGNOSIS — H44113 Panuveitis, bilateral: Secondary | ICD-10-CM | POA: Diagnosis not present

## 2019-08-16 DIAGNOSIS — Z79899 Other long term (current) drug therapy: Secondary | ICD-10-CM | POA: Diagnosis not present

## 2019-08-16 LAB — POCT INR: INR: 3 (ref 2.0–3.0)

## 2019-08-16 NOTE — Progress Notes (Signed)
Anticoagulation Management Steven Brennan is a 69 y.o. male who reports to the clinic for monitoring of warfarin treatment.    Indication: atrial fibrillation CHA2DS2 Vasc Score 2 (HTN Hx, Age 33-74), HAS-BLED 1 (Age >14)  Duration: indefinite Supervising physician: Manish Patwardhan  Anticoagulation Clinic Visit History:  Patient does not report signs/symptoms of bleeding or thromboembolism.  Other recent changes: No change in diet, medications, lifestyle.   Pt wanting to switch to [employer]. Called pt's pharmacy to review cost. Quoted $26 for 3 month supply. Pt would like to transition back to [place]. Reviewed transition directions with the pt.   Anticoagulation Episode Summary    Current INR goal:  2.0-3.0  TTR:  --  Next INR check:  08/17/2019  INR from last check:  3.0 (08/16/2019)  Weekly max warfarin dose:  35 mg  Target end date:  08/18/2019  INR check location:  Anticoagulation Clinic  Preferred lab:    Send INR reminders to:     Indications   Paroxysmal atrial fibrillation (HCC) [I48.0] Monitoring for long-term anticoagulant use [Z51.81 Z79.01]       Comments:        Anticoagulation Care Providers    Provider Role Specialty Phone number   Elder Negus, MD Referring Cardiology [phone]      Allergies  Allergen Reactions  . Trazodone And Nefazodone     Passed out    Current Outpatient Medications:  .  acetaminophen (TYLENOL) 325 MG tablet, Take 650 mg by mouth every 6 (six) hours as needed., Disp: , Rfl:  .  allopurinol (ZYLOPRIM) 100 MG tablet, Take 100 mg by mouth daily., Disp: , Rfl:  .  atorvastatin (LIPITOR) 40 MG tablet, Take 40 mg by mouth daily., Disp: , Rfl:  .  folic acid (FOLVITE) 1 MG tablet, Take 1 mg by mouth daily. Take 5 tablet, Disp: , Rfl:  .  methotrexate (RHEUMATREX) 2.5 MG tablet, Take 15 mg by mouth once a week. , Disp: , Rfl:  .  metoprolol tartrate (LOPRESSOR) 25 MG tablet, TAKE 1 TABLET BY MOUTH TWICE A DAY, Disp: 180 tablet,  Rfl: 1 .  multivitamin-iron-minerals-folic acid (CENTRUM) chewable tablet, Chew 1 tablet by mouth daily., Disp: , Rfl:  .  pantoprazole (PROTONIX) 40 MG tablet, Take 40 mg by mouth daily., Disp: , Rfl:  .  prednisoLONE acetate (PRED FORTE) 1 % ophthalmic suspension, Place 1 drop into the left eye 3 (three) times daily. , Disp: , Rfl:  .  psyllium (METAMUCIL) 58.6 % packet, Take 1 packet by mouth daily., Disp: , Rfl:  .  telmisartan-hydrochlorothiazide (MICARDIS HCT) 80-12.5 MG tablet, Take 1 tablet by mouth daily., Disp: , Rfl:  .  warfarin (COUMADIN) 5 MG tablet, Take 1 tablet (5 mg total) by mouth daily at 4 PM., Disp: 90 tablet, Rfl: 0 Past Medical History:  Diagnosis Date  . Gout   . Hyperlipidemia   . Hypertension   . Vitamin D deficiency     ASSESSMENT  Recent Results: The most recent result is correlated with 40 mg per week:  Lab Results  Component Value Date   INR 3.0 08/16/2019   INR 1.6 (A) 08/12/2019    Anticoagulation Dosing: Description   INR at goal. HOLD warfarin today and tomorrow. Start Xeralto 20 mg starting 08/18/19.       INR today: Therapeutic following dose increase last check. Reviewed Eliquis patient assistance application with pt. Pt doesn't meet the financial eligibility criteria.Pt was previously quoted $76. Pt wasn't unsure  of day supply. Called pt's pharmacy, per pharmacist, 3 month supply of Xeralto 20 mg daily of $26. Pt interested in transitioning to [place]. With the [facility] transition goal of INR <2, instructed pt to hold warfarin for 2 days. Pt to start Xeralto 20 mg on 08/18/19. Pt verbalized understanding.   PLAN Transition to [employer]. Hold warfarin for 2 days and then start Xeralto on 08/18/19.   Patient Instructions  INR at goal. HOLD warfarin today and tomorrow. Start Xeralto 20 mg starting 08/18/19  Patient advised to contact clinic or seek medical attention if signs/symptoms of bleeding or thromboembolism occur.  Patient verbalized  understanding by repeating back information and was advised to contact me if further medication-related questions arise.   Follow-up No follow-ups on file.  Leonides Schanz, PharmD  15 minutes spent face-to-face with the patient during the encounter. 50% of time spent on education, including signs/sx bleeding and clotting, as well as food and drug interactions with warfarin. 50% of time was spent on fingerprick POC INR sample collection,processing, results determination, and documentation

## 2019-08-16 NOTE — Patient Instructions (Signed)
INR at goal. HOLD warfarin today and tomorrow. Start Xeralto 20 mg starting 08/18/19

## 2019-08-17 DIAGNOSIS — I10 Essential (primary) hypertension: Secondary | ICD-10-CM | POA: Diagnosis not present

## 2019-08-29 DIAGNOSIS — M1A0790 Idiopathic chronic gout, unspecified ankle and foot, without tophus (tophi): Secondary | ICD-10-CM | POA: Diagnosis not present

## 2019-08-29 DIAGNOSIS — E782 Mixed hyperlipidemia: Secondary | ICD-10-CM | POA: Diagnosis not present

## 2019-08-29 DIAGNOSIS — I10 Essential (primary) hypertension: Secondary | ICD-10-CM | POA: Diagnosis not present

## 2019-08-29 DIAGNOSIS — K219 Gastro-esophageal reflux disease without esophagitis: Secondary | ICD-10-CM | POA: Diagnosis not present

## 2019-09-05 NOTE — Addendum Note (Signed)
Addended by: Cassell Clement T on: 09/05/2019 09:24 AM   Modules accepted: Orders

## 2019-09-06 DIAGNOSIS — E782 Mixed hyperlipidemia: Secondary | ICD-10-CM | POA: Diagnosis not present

## 2019-09-06 DIAGNOSIS — I10 Essential (primary) hypertension: Secondary | ICD-10-CM | POA: Diagnosis not present

## 2019-09-06 DIAGNOSIS — K219 Gastro-esophageal reflux disease without esophagitis: Secondary | ICD-10-CM | POA: Diagnosis not present

## 2019-09-06 DIAGNOSIS — M1A0790 Idiopathic chronic gout, unspecified ankle and foot, without tophus (tophi): Secondary | ICD-10-CM | POA: Diagnosis not present

## 2019-09-06 DIAGNOSIS — M109 Gout, unspecified: Secondary | ICD-10-CM | POA: Diagnosis not present

## 2019-09-06 DIAGNOSIS — G4733 Obstructive sleep apnea (adult) (pediatric): Secondary | ICD-10-CM | POA: Diagnosis not present

## 2019-09-06 DIAGNOSIS — E559 Vitamin D deficiency, unspecified: Secondary | ICD-10-CM | POA: Diagnosis not present

## 2019-09-06 DIAGNOSIS — I700 Atherosclerosis of aorta: Secondary | ICD-10-CM | POA: Diagnosis not present

## 2019-09-17 DIAGNOSIS — I10 Essential (primary) hypertension: Secondary | ICD-10-CM | POA: Diagnosis not present

## 2019-09-19 DIAGNOSIS — G4733 Obstructive sleep apnea (adult) (pediatric): Secondary | ICD-10-CM | POA: Diagnosis not present

## 2019-10-17 ENCOUNTER — Telehealth: Payer: Self-pay | Admitting: Pharmacist

## 2019-10-17 DIAGNOSIS — I10 Essential (primary) hypertension: Secondary | ICD-10-CM | POA: Diagnosis not present

## 2019-10-18 DIAGNOSIS — I10 Essential (primary) hypertension: Secondary | ICD-10-CM | POA: Diagnosis not present

## 2019-10-18 DIAGNOSIS — E785 Hyperlipidemia, unspecified: Secondary | ICD-10-CM | POA: Diagnosis not present

## 2019-10-18 DIAGNOSIS — M109 Gout, unspecified: Secondary | ICD-10-CM | POA: Diagnosis not present

## 2019-10-18 DIAGNOSIS — H27132 Posterior dislocation of lens, left eye: Secondary | ICD-10-CM | POA: Diagnosis not present

## 2019-10-18 DIAGNOSIS — T8522XA Displacement of intraocular lens, initial encounter: Secondary | ICD-10-CM | POA: Diagnosis not present

## 2019-10-18 DIAGNOSIS — H2702 Aphakia, left eye: Secondary | ICD-10-CM | POA: Diagnosis not present

## 2019-10-18 DIAGNOSIS — H30102 Unspecified disseminated chorioretinal inflammation, left eye: Secondary | ICD-10-CM | POA: Diagnosis not present

## 2019-10-18 DIAGNOSIS — H4302 Vitreous prolapse, left eye: Secondary | ICD-10-CM | POA: Diagnosis not present

## 2019-10-18 DIAGNOSIS — G4733 Obstructive sleep apnea (adult) (pediatric): Secondary | ICD-10-CM | POA: Diagnosis not present

## 2019-10-18 DIAGNOSIS — Y838 Other surgical procedures as the cause of abnormal reaction of the patient, or of later complication, without mention of misadventure at the time of the procedure: Secondary | ICD-10-CM | POA: Diagnosis not present

## 2019-10-18 DIAGNOSIS — I480 Paroxysmal atrial fibrillation: Secondary | ICD-10-CM | POA: Diagnosis not present

## 2019-10-18 NOTE — Telephone Encounter (Signed)
Med list reviewed and updated. Pt recently started on sildenafil PRN per PCP.

## 2019-10-19 DIAGNOSIS — G4733 Obstructive sleep apnea (adult) (pediatric): Secondary | ICD-10-CM | POA: Diagnosis not present

## 2019-11-02 DIAGNOSIS — I2510 Atherosclerotic heart disease of native coronary artery without angina pectoris: Secondary | ICD-10-CM | POA: Insufficient documentation

## 2019-11-02 NOTE — Progress Notes (Signed)
Patient referred by Tammi Klippel* for syncope  Subjective:   Steven Brennan, male    DOB: [birth year], 69 y.o.   MRN: [id number]   Chief Complaint  Patient presents with   Paroxysmal atrial fibrillation ([place])   Follow-up    6 month     HPI  69 y.o. African American male with hypertension, CAD, paroxysmal Afib, h/o syncope.  He is tolerating Xarelto without any bleeding issues.  Home blood pressure monitoring data reviewed. Avg 147/85 HR 55 bpm. He denies chest pain,  palpitations, leg edema, orthopnea, PND, TIA/syncope.  He has mild exertional dyspnea with more than usual activity.  Blood pressure is elevated today.   Initiial consulation 01/2019: Patient is a truck Geophysicist/field seismologist. Three weeks ago, he was standing inside the [employer] in line. He admits that he had not had anything to eat or drink that morning, and had taking a sleeping pill, he thinks it was trazodone, the night before. He had sudden onset of flushing sensation, followed by sudden loss of consciousness. He did not have any preceding chest pain, shortness of breath, lightheadedness symptoms. He does not know how long he lost consciousness for. When he regained consciousness, he was surrounded by people who called EMS. EMS arrived, checked his BP, which was reportedly SBP 118 mmHg. He was not taken to the hospital. He has not had any recurrence of syncope since then.  At baseline, he is a former smoker, is on antihypertensive medications. He walks about a mile 2-3 days a week.  He endorses "chest congestion" when he is walking, that improves after he clears his throat.    Current Outpatient Medications on File Prior to Visit  Medication Sig Dispense Refill   acetaminophen (TYLENOL) 325 MG tablet Take 650 mg by mouth every 6 (six) hours as needed.     allopurinol (ZYLOPRIM) 100 MG tablet Take 100 mg by mouth daily.     atorvastatin (LIPITOR) 40 MG tablet Take 40 mg by mouth daily.     folic acid (FOLVITE) 1 MG tablet  Take 1 mg by mouth daily. Take 5 tablet     methotrexate (RHEUMATREX) 2.5 MG tablet Take 15 mg by mouth once a week.      metoprolol tartrate (LOPRESSOR) 25 MG tablet TAKE 1 TABLET BY MOUTH TWICE A DAY 180 tablet 1   multivitamin-iron-minerals-folic acid (CENTRUM) chewable tablet Chew 1 tablet by mouth daily.     pantoprazole (PROTONIX) 40 MG tablet Take 40 mg by mouth daily.     prednisoLONE acetate (PRED FORTE) 1 % ophthalmic suspension Place 1 drop into the left eye 3 (three) times daily.      psyllium (METAMUCIL) 58.6 % packet Take 1 packet by mouth daily.     rivaroxaban (XARELTO) 20 MG TABS tablet Take 20 mg by mouth daily with supper. Starting 08/18/19     sildenafil (VIAGRA) 100 MG tablet Take 100 mg by mouth daily as needed for erectile dysfunction.     telmisartan-hydrochlorothiazide (MICARDIS HCT) 80-12.5 MG tablet Take 1 tablet by mouth daily. Taking it in the morning     No current facility-administered medications on file prior to visit.    Cardiovascular and other pertinent studies:  EKG 11/07/2019: Sinus rhythm 57 bpm Normal EKG  CTA 04/15/2019: 1. Left Main: FFR = 0.96 2. LAD: Proximal FFR = 0.94, Mid FFR = 0.89, Distal FFR = 0.50, Diagonal 1 FFR = 0.93 3. Ramus intermedius FFR = proximal 0.92, distal 0.84 4. LCX: Proximal FFR =  0.88, Distal FFR = 0.83, distal portion not mapped. 5. RCA: Proximal FFR =0.96, Mid FFR = 0.93, Distal FFR = 0.90  IMPRESSION: 1. CT FFR analysis showed hemodynamically significant stenosis in the distal portion of the LAD. The vessel is small caliber at this location.  2.  No hemodynamically significant stenoses in the RCA.  Event monitor 02/08/2019 - 03/09/2019:  Diagnostic time: 100%  Dominant rhythm: Sinus.  HR 47-139 bpm. Avg HR 71 bpm.  Episodes of Afib w/RVR notes. Afib burden <1%.  Occasional 3-4 beat NSVT noted.  No other arrhythmia seen.  No symptoms reported.    EKG 02/08/2019: Sinus rhythm 71 bpm. Normal  EKG.   EKG 01/17/2019: Sinus rhythm 80 bpm.  Frequent PACs.   Recent labs: 01/17/2019: Glucose 110, BUN/Cr 18/1.2. EGFR 77. Na/K 133/4.2.  Total bilirubin 1.4.  Rest of the CMP normal H/H 11/33. MCV 97. Platelets 454   Review of Systems  Cardiovascular: Negative for chest pain, dyspnea on exertion, leg swelling, palpitations and syncope.         Vitals:   11/07/19 1120  BP: (!) 149/78  Pulse: (!) 55  Resp: 16  SpO2: 95%      Body mass index is 29.62 kg/m. Filed Weights   11/07/19 1120  Weight: 173 lb (78.5 kg)     Objective:   Physical Exam Vitals and nursing note reviewed.  Constitutional:      Appearance: He is well-developed.  Neck:     Vascular: No JVD.  Cardiovascular:     Rate and Rhythm: Normal rate and regular rhythm.     Pulses: Intact distal pulses.     Heart sounds: Normal heart sounds. No murmur heard.   Pulmonary:     Effort: Pulmonary effort is normal.     Breath sounds: Normal breath sounds. No wheezing or rales.         Assessment & Recommendations:   69 y.o. African Ameri male with hypertension, CAD, paroxysmal Afib, h/o syncope.  CAD: Largely nonobstructive, except distal small caliber LAD. Continue medical management. Not on Aspirin due to ongoing use of Eliquis for Afib. Continue hypertension, hyperlipidemia management. Continue statin.   Syncope: No recurrence. Likely vasovagal. Occurred in Dec 2020. 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   Paroxysmal Afib: CHA2DS2VASc score 2, annual stroke risk 2.2%. Continue Eliquis 5 mg twice daily, and metoprolol tartrate 25 mg twice daily. Eliquis samples given.    Hypertension:  Control.  Could be responsible for his exertional dyspnea.  Added amlodipine 5 mg daily.  F/u in 4 weeks  [place], MD [facility] Pager: [phone] Office: [phone]

## 2019-11-07 ENCOUNTER — Ambulatory Visit: Payer: Medicare HMO | Admitting: Cardiology

## 2019-11-07 ENCOUNTER — Other Ambulatory Visit: Payer: Self-pay

## 2019-11-07 ENCOUNTER — Encounter: Payer: Self-pay | Admitting: Cardiology

## 2019-11-07 VITALS — BP 149/78 | HR 55 | Resp 16 | Ht 65.0 in | Wt 173.0 lb

## 2019-11-07 DIAGNOSIS — I10 Essential (primary) hypertension: Secondary | ICD-10-CM

## 2019-11-07 DIAGNOSIS — H30033 Focal chorioretinal inflammation, peripheral, bilateral: Secondary | ICD-10-CM | POA: Diagnosis not present

## 2019-11-07 DIAGNOSIS — Z79899 Other long term (current) drug therapy: Secondary | ICD-10-CM | POA: Diagnosis not present

## 2019-11-07 DIAGNOSIS — I480 Paroxysmal atrial fibrillation: Secondary | ICD-10-CM

## 2019-11-07 DIAGNOSIS — I2510 Atherosclerotic heart disease of native coronary artery without angina pectoris: Secondary | ICD-10-CM

## 2019-11-07 DIAGNOSIS — H30102 Unspecified disseminated chorioretinal inflammation, left eye: Secondary | ICD-10-CM | POA: Diagnosis not present

## 2019-11-07 MED ORDER — AMLODIPINE BESYLATE 5 MG PO TABS: 5.0000 mg | ORAL_TABLET | Freq: Every day | ORAL | 3 refills | Status: DC

## 2019-11-13 ENCOUNTER — Other Ambulatory Visit: Payer: Self-pay | Admitting: Cardiology

## 2019-11-13 DIAGNOSIS — I10 Essential (primary) hypertension: Secondary | ICD-10-CM

## 2019-11-17 DIAGNOSIS — I10 Essential (primary) hypertension: Secondary | ICD-10-CM | POA: Diagnosis not present

## 2019-11-19 DIAGNOSIS — G4733 Obstructive sleep apnea (adult) (pediatric): Secondary | ICD-10-CM | POA: Diagnosis not present

## 2019-12-06 ENCOUNTER — Ambulatory Visit: Payer: Medicare HMO | Admitting: Cardiology

## 2019-12-10 ENCOUNTER — Telehealth: Payer: Self-pay

## 2019-12-10 NOTE — Telephone Encounter (Signed)
Called pt to ask him if he has done patient assistance. Pt mention he has not. Informed pt to stop by tomorrow to fill out the paper work for patient assistance. Pt will stop by the office tomorrow.

## 2019-12-10 NOTE — Telephone Encounter (Addendum)
Is patient assistance not working?

## 2019-12-10 NOTE — Telephone Encounter (Signed)
Pt called and mention that his Xarelto was to expensive for him, and if there was another medication you can prescribe him.

## 2019-12-10 NOTE — Telephone Encounter (Signed)
Initially transitioned from warfarin to [facility] for a copay of $26/month. Pt was agreeable to pay the copay and continue with Xeralto. However, his [facility] copay has since increased to $47/month. Called pharmacy and insurance to confirm and clarify. Similar price for Eliquis and Pradaxa. Pt was recently unable to work for the past couple of months and isnt able to continue paying for the copay. Pt interested in applying for patient assistance. Pt to come to the office tomorrow with requested documents to complete the PAP application. Pt's last fill was on November 10th. Pt agreeable to continue with Xeralto for now and pursue PAP process. If not willing to consider transitioning back to warfarin.

## 2019-12-17 ENCOUNTER — Other Ambulatory Visit: Payer: Self-pay | Admitting: Cardiology

## 2019-12-17 DIAGNOSIS — I480 Paroxysmal atrial fibrillation: Secondary | ICD-10-CM

## 2019-12-17 DIAGNOSIS — I10 Essential (primary) hypertension: Secondary | ICD-10-CM | POA: Diagnosis not present

## 2019-12-17 DIAGNOSIS — H3581 Retinal edema: Secondary | ICD-10-CM | POA: Diagnosis not present

## 2019-12-23 NOTE — Telephone Encounter (Signed)
Xeralto PAP approved today. Pt to start getting Xeralto shipped through the manufacturer directly moving forward. Reviewed with pt. Pt verbalized understanding for need to contact the office if he needs help renewing for next year. Pt still has Xeralto rx at home that he will continue to take while waiting for the new delivery

## 2019-12-24 ENCOUNTER — Telehealth: Payer: Self-pay

## 2019-12-24 NOTE — Telephone Encounter (Signed)
Patient has been approved for Patient Assistance for Xarelto until 12/22/20

## 2019-12-30 ENCOUNTER — Telehealth: Payer: Self-pay | Admitting: Family Medicine

## 2019-12-30 NOTE — Telephone Encounter (Signed)
LMVM for pt that returning call.  What kind eye surgery.  Surgeon not allow use??  When able.  Did you call DME to let them know as well?

## 2019-12-30 NOTE — Telephone Encounter (Signed)
Pt. states he hasn't been able to use CPAP machine since Oct. He states he had eye surgery & is not able to put pressure on eye. He hasn't been able to work & wants to make sure this will not mess him up with CPAP machine. Please advise.

## 2019-12-30 NOTE — Telephone Encounter (Signed)
I spoke to pt and he spoke to Dr. Sherryll Burger and was told it had been 2 months that he thought would be ok to use cpap.  He will start with what he has nasal pillows and then if needs additional help will call us back.  He never did get chin straps or see for mask refitting.

## 2020-01-02 ENCOUNTER — Other Ambulatory Visit: Payer: Self-pay | Admitting: Cardiology

## 2020-01-06 ENCOUNTER — Telehealth: Payer: Self-pay | Admitting: Family Medicine

## 2020-01-06 NOTE — Telephone Encounter (Signed)
Spoke with pt and relayed that order in Epic from 05-22-19 and should be able to contact then about mask.  Will send community message. (with his message attached ) to see if they can help.  I gave him # as well to see if they can assist him over phone.  Pt verbalized understanding.

## 2020-01-06 NOTE — Telephone Encounter (Signed)
Pt still having problems with mask, keep going into his eyes.  Pt's eyes are drying out because of this.  Pt wanting to discuss another type of mask.

## 2020-01-15 NOTE — Telephone Encounter (Signed)
Kathee Delton, RN got it, thanks

## 2020-01-16 ENCOUNTER — Ambulatory Visit: Payer: Medicare HMO | Admitting: Cardiology

## 2020-01-17 DIAGNOSIS — I10 Essential (primary) hypertension: Secondary | ICD-10-CM | POA: Diagnosis not present

## 2020-02-11 ENCOUNTER — Telehealth: Payer: Self-pay | Admitting: Neurology

## 2020-02-11 NOTE — Telephone Encounter (Signed)
I called the pt. He sts he wanted [place] to be aware for several weeks in Nov and December he was unable to use his CPAP due to a surgical eye procedure he had. Pt was able to resume cpap therapy in Jan and has been doing well.  Pt will keep f/u in May as scheduled with AL, NP

## 2020-02-11 NOTE — Telephone Encounter (Signed)
Pt. Came in today stating that he needs to speak with Dr. Teofilo Pod nurse about what type of eye surgery he had. Best call back is [phone].

## 2020-02-27 DIAGNOSIS — Z0000 Encounter for general adult medical examination without abnormal findings: Secondary | ICD-10-CM | POA: Diagnosis not present

## 2020-02-27 DIAGNOSIS — I10 Essential (primary) hypertension: Secondary | ICD-10-CM | POA: Diagnosis not present

## 2020-02-27 DIAGNOSIS — M109 Gout, unspecified: Secondary | ICD-10-CM | POA: Diagnosis not present

## 2020-02-27 DIAGNOSIS — E782 Mixed hyperlipidemia: Secondary | ICD-10-CM | POA: Diagnosis not present

## 2020-02-28 DIAGNOSIS — K219 Gastro-esophageal reflux disease without esophagitis: Secondary | ICD-10-CM | POA: Diagnosis not present

## 2020-02-28 DIAGNOSIS — Z0000 Encounter for general adult medical examination without abnormal findings: Secondary | ICD-10-CM | POA: Diagnosis not present

## 2020-02-28 DIAGNOSIS — M109 Gout, unspecified: Secondary | ICD-10-CM | POA: Diagnosis not present

## 2020-02-28 DIAGNOSIS — E782 Mixed hyperlipidemia: Secondary | ICD-10-CM | POA: Diagnosis not present

## 2020-02-28 DIAGNOSIS — I480 Paroxysmal atrial fibrillation: Secondary | ICD-10-CM | POA: Diagnosis not present

## 2020-02-28 DIAGNOSIS — E559 Vitamin D deficiency, unspecified: Secondary | ICD-10-CM | POA: Diagnosis not present

## 2020-02-28 DIAGNOSIS — G4733 Obstructive sleep apnea (adult) (pediatric): Secondary | ICD-10-CM | POA: Diagnosis not present

## 2020-02-28 DIAGNOSIS — I700 Atherosclerosis of aorta: Secondary | ICD-10-CM | POA: Diagnosis not present

## 2020-02-28 DIAGNOSIS — M1A0790 Idiopathic chronic gout, unspecified ankle and foot, without tophus (tophi): Secondary | ICD-10-CM | POA: Diagnosis not present

## 2020-02-28 DIAGNOSIS — I10 Essential (primary) hypertension: Secondary | ICD-10-CM | POA: Diagnosis not present

## 2020-03-05 ENCOUNTER — Ambulatory Visit: Payer: Medicare HMO | Admitting: Cardiology

## 2020-03-10 DIAGNOSIS — H2702 Aphakia, left eye: Secondary | ICD-10-CM | POA: Diagnosis not present

## 2020-03-10 DIAGNOSIS — Z79899 Other long term (current) drug therapy: Secondary | ICD-10-CM | POA: Diagnosis not present

## 2020-03-10 DIAGNOSIS — H3581 Retinal edema: Secondary | ICD-10-CM | POA: Diagnosis not present

## 2020-03-10 DIAGNOSIS — H44113 Panuveitis, bilateral: Secondary | ICD-10-CM | POA: Diagnosis not present

## 2020-03-10 DIAGNOSIS — H30102 Unspecified disseminated chorioretinal inflammation, left eye: Secondary | ICD-10-CM | POA: Diagnosis not present

## 2020-03-10 DIAGNOSIS — H30033 Focal chorioretinal inflammation, peripheral, bilateral: Secondary | ICD-10-CM | POA: Diagnosis not present

## 2020-03-19 DIAGNOSIS — I10 Essential (primary) hypertension: Secondary | ICD-10-CM | POA: Diagnosis not present

## 2020-03-24 DIAGNOSIS — H30102 Unspecified disseminated chorioretinal inflammation, left eye: Secondary | ICD-10-CM | POA: Diagnosis not present

## 2020-03-24 DIAGNOSIS — H2702 Aphakia, left eye: Secondary | ICD-10-CM | POA: Diagnosis not present

## 2020-03-24 DIAGNOSIS — H30033 Focal chorioretinal inflammation, peripheral, bilateral: Secondary | ICD-10-CM | POA: Diagnosis not present

## 2020-03-24 DIAGNOSIS — H3581 Retinal edema: Secondary | ICD-10-CM | POA: Diagnosis not present

## 2020-03-24 DIAGNOSIS — Z79899 Other long term (current) drug therapy: Secondary | ICD-10-CM | POA: Diagnosis not present

## 2020-03-24 DIAGNOSIS — H44113 Panuveitis, bilateral: Secondary | ICD-10-CM | POA: Diagnosis not present

## 2020-03-25 DIAGNOSIS — H52223 Regular astigmatism, bilateral: Secondary | ICD-10-CM | POA: Diagnosis not present

## 2020-03-25 DIAGNOSIS — H524 Presbyopia: Secondary | ICD-10-CM | POA: Diagnosis not present

## 2020-05-18 DIAGNOSIS — I10 Essential (primary) hypertension: Secondary | ICD-10-CM | POA: Diagnosis not present

## 2020-05-21 ENCOUNTER — Ambulatory Visit: Payer: Medicare HMO | Admitting: Family Medicine

## 2020-05-21 ENCOUNTER — Encounter: Payer: Self-pay | Admitting: Family Medicine

## 2020-05-21 NOTE — Patient Instructions (Incomplete)
Please continue using your CPAP regularly. While your insurance requires that you use CPAP at least 4 hours each night on 70% of the nights, I recommend, that you not skip any nights and use it throughout the night if you can. Getting used to CPAP and staying with the treatment long term does take time and patience and discipline. Untreated obstructive sleep apnea when it is moderate to severe can have an adverse impact on cardiovascular health and raise her risk for heart disease, arrhythmias, hypertension, congestive heart failure, stroke and diabetes. Untreated obstructive sleep apnea causes sleep disruption, nonrestorative sleep, and sleep deprivation. This can have an impact on your day to day functioning and cause daytime sleepiness and impairment of cognitive function, memory loss, mood disturbance, and problems focussing. Using CPAP regularly can improve these symptoms.   Follow up in   Sleep Apnea Sleep apnea affects breathing during sleep. It causes breathing to stop for a short time or to become shallow. It can also increase the risk of:  Heart attack.  Stroke.  Being very overweight (obese).  Diabetes.  Heart failure.  Irregular heartbeat. The goal of treatment is to help you breathe normally again. What are the causes? There are three kinds of sleep apnea:  Obstructive sleep apnea. This is caused by a blocked or collapsed airway.  Central sleep apnea. This happens when the brain does not send the right signals to the muscles that control breathing.  Mixed sleep apnea. This is a combination of obstructive and central sleep apnea. The most common cause of this condition is a collapsed or blocked airway. This can happen if:  Your throat muscles are too relaxed.  Your tongue and tonsils are too large.  You are overweight.  Your airway is too small.   What increases the risk?  Being overweight.  Smoking.  Having a small airway.  Being older.  Being  male.  Drinking alcohol.  Taking medicines to calm yourself (sedatives or tranquilizers).  Having family members with the condition. What are the signs or symptoms?  Trouble staying asleep.  Being sleepy or tired during the day.  Getting angry a lot.  Loud snoring.  Headaches in the morning.  Not being able to focus your mind (concentrate).  Forgetting things.  Less interest in sex.  Mood swings.  Personality changes.  Feelings of sadness (depression).  Waking up a lot during the night to pee (urinate).  Dry mouth.  Sore throat. How is this diagnosed?  Your medical history.  A physical exam.  A test that is done when you are sleeping (sleep study). The test is most often done in a sleep lab but may also be done at home. How is this treated?  Sleeping on your side.  Using a medicine to get rid of mucus in your nose (decongestant).  Avoiding the use of alcohol, medicines to help you relax, or certain pain medicines (narcotics).  Losing weight, if needed.  Changing your diet.  Not smoking.  Using a machine to open your airway while you sleep, such as: ? An oral appliance. This is a mouthpiece that shifts your lower jaw forward. ? A CPAP device. This device blows air through a mask when you breathe out (exhale). ? An EPAP device. This has valves that you put in each nostril. ? A BPAP device. This device blows air through a mask when you breathe in (inhale) and breathe out.  Having surgery if other treatments do not work. It is important  to get treatment for sleep apnea. Without treatment, it can lead to:  High blood pressure.  Coronary artery disease.  In men, not being able to have an erection (impotence).  Reduced thinking ability.   Follow these instructions at home: Lifestyle  Make changes that your doctor recommends.  Eat a healthy diet.  Lose weight if needed.  Avoid alcohol, medicines to help you relax, and some pain  medicines.  Do not use any products that contain nicotine or tobacco, such as cigarettes, e-cigarettes, and chewing tobacco. If you need help quitting, ask your doctor. General instructions  Take over-the-counter and prescription medicines only as told by your doctor.  If you were given a machine to use while you sleep, use it only as told by your doctor.  If you are having surgery, make sure to tell your doctor you have sleep apnea. You may need to bring your device with you.  Keep all follow-up visits as told by your doctor. This is important. Contact a doctor if:  The machine that you were given to use during sleep bothers you or does not seem to be working.  You do not get better.  You get worse. Get help right away if:  Your chest hurts.  You have trouble breathing in enough air.  You have an uncomfortable feeling in your back, arms, or stomach.  You have trouble talking.  One side of your body feels weak.  A part of your face is hanging down. These symptoms may be an emergency. Do not wait to see if the symptoms will go away. Get medical help right away. Call your local emergency services (911 in the [place] Do not drive yourself to the hospital. Summary  This condition affects breathing during sleep.  The most common cause is a collapsed or blocked airway.  The goal of treatment is to help you breathe normally while you sleep. This information is not intended to replace advice given to you by your health care provider. Make sure you discuss any questions you have with your health care provider. Document Revised: 10/20/2017 Document Reviewed: 08/29/2017 Elsevier Patient Education  2021 [employer]  

## 2020-05-21 NOTE — Progress Notes (Deleted)
PATIENT: Steven Brennan DOB: [birth year]  REASON FOR VISIT: follow up HISTORY FROM: patient  No chief complaint on file.    HISTORY OF PRESENT ILLNESS: 05/21/20 ALL:  Steven Brennan returns for follow up for OSA on CPAP.      05/22/2019 ALL:  Steven Brennan is a 70 y.o. male here today for follow up for OSA on CPAP.  He reports that he is doing better with CPAP therapy at home.  He has been more consistent with daily and 4-hour compliance.  He has not noted any significant difference in how he feels.  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  He feels that this will help as he will have a regular schedule.  He is currently using a nasal pillow.  He does report dry mouth and dry eyes on occasion.  Compliance report dated 04/21/2019 through 05/20/2019 reveals that he used CPAP 25 of the past 30 days for compliance of 83%.  He used CPAP greater than 4 hours 23 of the last 30 days for compliance of 77%.  Average usage was 5 hours and 5 minutes.  Residual AHI was 0.5 on 11 cm of water and an EPR of 1.  Leak noted in the centile at 15.2 L/min.  HISTORY: (copied from my note on 02/18/2019)  Steven Brennan is a 70 y.o. male here today for follow up for OSA on CPAP. He has continued to use CPAP but admits that compliance is still sup optimum. He reports having to delay delivery of supplies due to financial concerns. He is out of work and could not afford new supplies. He has recently received new supplies and is trying to use CPAP every night. He denies difficulty with machine or supplies. He does note benefit with CPAP therapy. He is currently wearing a heart monitor.   Compliance report reveals that he used CPAP 17/30 days for compliance of 57%. He used CPAP greater than 4 hours 14/30 days for compliance of 47%. Average usage was 4 hours and 10 minutes. Residual AHI was 0.4 on 11cmH20 and EPR of 1. There was no significant leak.   HISTORY: (copied from my note on 11/19/2018)  Steven Brennan a 70  y.o.malehere today for follow up of OSA on CPAP.He reports that over the last few months he has had difficulty using CPAP. He has had an infection in his eye and feels that CPAP therapy was intolerable.He has been treated by ophthalmology and infection has resolved. He has resumed CPAP therapy but states that now he is having difficulty with dry mouth. He has adjusted his humidity settings.He feels that he can continue to work with humidity settings at home.  Compliance report dated 10/16/2018 through 11/14/2018 reveals that he has used CPAP 20 out of the last 30 days for compliance of 67%. He used CPAP greater than 4 hours 15 of the last 30 days for compliance of 50%. Average usage was 4 hours and 25 minutes. AHI was 0.6 on 11 cm of water and an EPR of 1. There was no significant leak noted.  HISTORY: (copied fromCarolyn Martin'snote on 03/12/2018)  UPDATE2/24/2020CMMr. Brennan,70 year old male returns for follow-up with newly diagnosed obstructive sleep apnea here for initial CPAP compliance. He is still getting adjusted to his machine but he has good compliance. Data dated 02/10/2018-03/11/2018 shows compliance greater than 4 hours at 93%. Average usage 5 hours 29 minutes. Set pressure 11 cm. EPR level 1 AHI 0.7. He returns for reevaluation  9/16/19[name] is a 70 year old right-handed gentleman with an underlying medical history of hypertension, reflux disease, gout, left eye injury in the past, mitral regurgitation and mild diastolic dysfunction, vitamin D deficiency and overweight state, who reports snoring and excessive daytime somnolence as well and has difficulty maintaining sleep. I reviewed your office note from 06/20/2017, which you kindly included. His Epworth sleepiness score is 9 out of 24, fatigue score is 17 out of 63. He lives alone, is currently separated. He has 6 daughters. 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 He  has a bedtime of 11 PM or midnight. He falls asleep fairly quickly but uses an over-the-counter sleep aid called Power sleep p.m. He takes 2 pills each night. Rise time is around 5:30 or 6. He has nocturia about once for average night, typically around 4 AM, he denies morning headaches. He was told in the past that he had a heart murmur. He has not been told that he has irregular heartbeat. He quit smoking over 15 years ago. He drinks alcohol occasionally, maybe once on the weekends, drinks caffeine in the form of coffee, less than one cup per day on average, more consistently in the wintertime    REVIEW OF SYSTEMS: Out of a complete 14 system review of symptoms, the patient complains only of the following symptoms, none and all other reviewed systems are negative.   ALLERGIES: Allergies  Allergen Reactions  . Trazodone And Nefazodone     Passed out    HOME MEDICATIONS: Outpatient Medications Prior to Visit  Medication Sig Dispense Refill  . acetaminophen (TYLENOL) 325 MG tablet Take 650 mg by mouth every 6 (six) hours as needed.    Marland Kitchen allopurinol (ZYLOPRIM) 100 MG tablet Take 100 mg by mouth daily.    Marland Kitchen amLODipine (NORVASC) 5 MG tablet TAKE 1 TABLET BY MOUTH EVERY DAY 90 tablet 1  . atorvastatin (LIPITOR) 40 MG tablet Take 40 mg by mouth daily.    . folic acid (FOLVITE) 1 MG tablet Take 1 mg by mouth daily. Take 5 tablet    . methotrexate (RHEUMATREX) 2.5 MG tablet Take 15 mg by mouth once a week.     . metoprolol tartrate (LOPRESSOR) 25 MG tablet TAKE 1 TABLET BY MOUTH TWICE A DAY 180 tablet 1  . multivitamin-iron-minerals-folic acid (CENTRUM) chewable tablet Chew 1 tablet by mouth daily.    . pantoprazole (PROTONIX) 40 MG tablet Take 40 mg by mouth daily.    . prednisoLONE acetate (PRED FORTE) 1 % ophthalmic suspension Place 1 drop into the left eye 3 (three) times daily.     . psyllium (METAMUCIL) 58.6 % packet Take 1 packet by mouth daily.    . sildenafil (VIAGRA) 100 MG tablet Take  100 mg by mouth daily as needed for erectile dysfunction.    Marland Kitchen telmisartan-hydrochlorothiazide (MICARDIS HCT) 80-12.5 MG tablet Take 1 tablet by mouth daily. Taking it in the morning    . XARELTO 20 MG TABS tablet TAKE 1 TABLET BY MOUTH DAILY WITH SUPPER 90 tablet 1   No facility-administered medications prior to visit.    PAST MEDICAL HISTORY: Past Medical History:  Diagnosis Date  . Gout   . Hyperlipidemia   . Hypertension   . Vitamin D deficiency     PAST SURGICAL HISTORY: Past Surgical History:  Procedure Laterality Date  . EYE SURGERY  1993   due to injury    FAMILY HISTORY: Family History  Problem Relation  Age of Onset  . Colon cancer Father   . Diabetes Mother   . Kidney disease Mother   . Arrhythmia Brother   . Hypertension Brother   . Diabetes Brother   . Hypertension Brother   . Cancer Brother     SOCIAL HISTORY: Social History   Socioeconomic History  . Marital status: Legally Separated    Spouse name: Not on file  . Number of children: 6  . Years of education: Not on file  . Highest education level: Not on file  Occupational History  . Not on file  Tobacco Use  . Smoking status: Former Smoker    Packs/day: 0.25    Years: 20.00    Pack years: 5.00    Types: Cigarettes    Quit date: 02/06/2006    Years since quitting: 14.2  . Smokeless tobacco: Never Used  Vaping Use  . Vaping Use: Never used  Substance and Sexual Activity  . Alcohol use: Yes    Comment: 2 times monthly "if that"  . Drug use: No  . Sexual activity: Not on file  Other Topics Concern  . Not on file  Social History Narrative  . Not on file   Social Determinants of Health   Financial Resource Strain: Not on file  Food Insecurity: Not on file  Transportation Needs: Not on file  Physical Activity: Not on file  Stress: Not on file  Social Connections: Not on file  Intimate Partner Violence: Not on file      PHYSICAL EXAM  There were no vitals filed for this  visit. There is no height or weight on file to calculate BMI.  Generalized: Well developed, in no acute distress  Cardiology: normal rate and rhythm, no murmur noted Respiratory: clear to auscultation bilaterally  Neurological examination  Mentation: Alert oriented to time, place, history taking. Follows all commands speech and language fluent Cranial nerve II-XII: Pupils were equal round reactive to light. Extraocular movements were full, visual field were full  Motor: The motor testing reveals 5 over 5 strength of all 4 extremities. Good symmetric motor tone is noted throughout.  Gait and station: Gait is normal.    DIAGNOSTIC DATA (LABS, IMAGING, TESTING) - I reviewed patient records, labs, notes, testing and imaging myself where available.  No flowsheet data found.   No results found for: WBC, HGB, HCT, MCV, PLT No results found for: NA, K, CL, CO2, GLUCOSE, BUN, CREATININE, CALCIUM, PROT, ALBUMIN, AST, ALT, ALKPHOS, BILITOT, GFRNONAA, GFRAA No results found for: CHOL, HDL, LDLCALC, LDLDIRECT, TRIG, CHOLHDL No results found for: WUJW1X No results found for: VITAMINB12 No results found for: TSH     ASSESSMENT AND PLAN 70 y.o. year old male  has a past medical history of Gout, Hyperlipidemia, Hypertension, and Vitamin D deficiency. here with   No diagnosis found.   Steven Brennan is doing much better with daily and 4-hour compliance.  Compliance report reveals acceptable daily compliance at 83% and 4-hour compliance is 77%.  He was encouraged to continue working on compliance goal of nightly usage and greater than 4 hours each night.  He does feel that the nasal pillow is the most comfortable mask, however, has not tried a chinstrap.  I will send orders today for supplies and consideration of a chinstrap.  He was advised that he may need to consider a mask refitting should he continue to have difficulty with dry eyes due to air leak.  Leak is acceptable at 15.2 L/min.  Overall,  he feels that  he is doing much better.  He will work on healthy lifestyle habits.  He will return to see me in 1 year, sooner if needed.  He verbalizes understanding and agreement with this plan.   No orders of the defined types were placed in this encounter.    No orders of the defined types were placed in this encounter.      Shawnie Dapper, FNP-C 05/21/2020, 7:16 AM [facility] [address] [place], [place] [address] [phone]

## 2020-05-25 NOTE — Progress Notes (Addendum)
PATIENT: Steven Brennan DOB: April 12, 1950  REASON FOR VISIT: follow up HISTORY FROM: patient  Chief Complaint  Patient presents with  . Follow-up    RM 1, alone, pt reports doing well with his CPAP, reports he cant afford cpap replacements and is having to clean them and use it longer then recommended.       HISTORY OF PRESENT ILLNESS: 05/26/20 ALL:  Steven Brennan returns for follow up for OSA on CPAP.      05/22/2019 ALL:  Steven Brennan is a 70 y.o. male here today for follow up for OSA on CPAP.  He reports that he is doing better with CPAP therapy at home.  He has been more consistent with daily and 4-hour compliance.  He has not noted any significant difference in how he feels.  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  He feels that this will help as he will have a regular schedule.  He is currently using a nasal pillow.  He does report dry mouth and dry eyes on occasion.  Compliance report dated 04/21/2019 through 05/20/2019 reveals that he used CPAP 25 of the past 30 days for compliance of 83%.  He used CPAP greater than 4 hours 23 of the last 30 days for compliance of 77%.  Average usage was 5 hours and 5 minutes.  Residual AHI was 0.5 on 11 cm of water and an EPR of 1.  Leak noted in the centile at 15.2 L/min.  HISTORY: (copied from my note on 02/18/2019)  Steven Brennan is a 70 y.o. male here today for follow up for OSA on CPAP. He has continued to use CPAP but admits that compliance is still sup optimum. He reports having to delay delivery of supplies due to financial concerns. He is out of work and could not afford new supplies. He has recently received new supplies and is trying to use CPAP every night. He denies difficulty with machine or supplies. He does note benefit with CPAP therapy. He is currently wearing a heart monitor.   Compliance report reveals that he used CPAP 17/30 days for compliance of 57%. He used CPAP greater than 4 hours 14/30 days for compliance of 47%.  Average usage was 4 hours and 10 minutes. Residual AHI was 0.4 on 11cmH20 and EPR of 1. There was no significant leak.   HISTORY: (copied from my note on 11/19/2018)  Steven Brennan a 70 y.o.malehere today for follow up of OSA on CPAP.He reports that over the last few months he has had difficulty using CPAP. He has had an infection in his eye and feels that CPAP therapy was intolerable.He has been treated by ophthalmology and infection has resolved. He has resumed CPAP therapy but states that now he is having difficulty with dry mouth. He has adjusted his humidity settings.He feels that he can continue to work with humidity settings at home.  Compliance report dated 10/16/2018 through 11/14/2018 reveals that he has used CPAP 20 out of the last 30 days for compliance of 67%. He used CPAP greater than 4 hours 15 of the last 30 days for compliance of 50%. Average usage was 4 hours and 25 minutes. AHI was 0.6 on 11 cm of water and an EPR of 1. There was no significant leak noted.  HISTORY: (copied fromCarolyn Martin'snote on 03/12/2018)  UPDATE2/24/2020CMMr. Brennan,70 year old male returns for follow-up with newly diagnosed obstructive sleep apnea here for initial CPAP compliance. He is still getting adjusted to his  machine but he has good compliance. Data dated 02/10/2018-03/11/2018 shows compliance greater than 4 hours at 93%. Average usage 5 hours 29 minutes. Set pressure 11 cm. EPR level 1 AHI 0.7. He returns for reevaluation  9/16/19[name] is a 70 year old right-handed gentleman with an underlying medical history of hypertension, reflux disease, gout, left eye injury in the past, mitral regurgitation and mild diastolic dysfunction, vitamin D deficiency and overweight state, who reports snoring and excessive daytime somnolence as well and has difficulty maintaining sleep. I reviewed your office note from 06/20/2017, which you kindly included. His Epworth sleepiness score  is 9 out of 24, fatigue score is 17 out of 63. He lives alone, is currently separated. He has 6 daughters. 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 He has a bedtime of 11 PM or midnight. He falls asleep fairly quickly but uses an over-the-counter sleep aid called Power sleep p.m. He takes 2 pills each night. Rise time is around 5:30 or 6. He has nocturia about once for average night, typically around 4 AM, he denies morning headaches. He was told in the past that he had a heart murmur. He has not been told that he has irregular heartbeat. He quit smoking over 15 years ago. He drinks alcohol occasionally, maybe once on the weekends, drinks caffeine in the form of coffee, less than one cup per day on average, more consistently in the wintertime    REVIEW OF SYSTEMS: Out of a complete 14 system review of symptoms, the patient complains only of the following symptoms, none and all other reviewed systems are negative.  ESS: 5 FSS: 41  ALLERGIES: Allergies  Allergen Reactions  . Trazodone And Nefazodone     Passed out    HOME MEDICATIONS: Outpatient Medications Prior to Visit  Medication Sig Dispense Refill  . acetaminophen (TYLENOL) 325 MG tablet Take 650 mg by mouth every 6 (six) hours as needed.    Marland Kitchen allopurinol (ZYLOPRIM) 100 MG tablet Take 100 mg by mouth daily.    Marland Kitchen amLODipine (NORVASC) 5 MG tablet TAKE 1 TABLET BY MOUTH EVERY DAY 90 tablet 1  . atorvastatin (LIPITOR) 40 MG tablet Take 40 mg by mouth daily.    . folic acid (FOLVITE) 1 MG tablet Take 1 mg by mouth daily. Take 5 tablet    . methotrexate (RHEUMATREX) 2.5 MG tablet Take 15 mg by mouth once a week.     . metoprolol tartrate (LOPRESSOR) 25 MG tablet TAKE 1 TABLET BY MOUTH TWICE A DAY 180 tablet 1  . multivitamin-iron-minerals-folic acid (CENTRUM) chewable tablet Chew 1 tablet by mouth daily.    . pantoprazole (PROTONIX) 40 MG tablet Take 40 mg by mouth daily.    . psyllium (METAMUCIL)  58.6 % packet Take 1 packet by mouth daily.    . sildenafil (VIAGRA) 100 MG tablet Take 100 mg by mouth daily as needed for erectile dysfunction.    Marland Kitchen telmisartan-hydrochlorothiazide (MICARDIS HCT) 80-12.5 MG tablet Take 1 tablet by mouth daily. Taking it in the morning    . XARELTO 20 MG TABS tablet TAKE 1 TABLET BY MOUTH DAILY WITH SUPPER 90 tablet 1  . prednisoLONE acetate (PRED FORTE) 1 % ophthalmic suspension Place 1 drop into the left eye 3 (three) times daily.      No facility-administered medications prior to visit.    PAST MEDICAL HISTORY: Past Medical History:  Diagnosis Date  . Gout   . Hyperlipidemia   .  Hypertension   . Vitamin D deficiency     PAST SURGICAL HISTORY: Past Surgical History:  Procedure Laterality Date  . EYE SURGERY  1993   due to injury    FAMILY HISTORY: Family History  Problem Relation Age of Onset  . Colon cancer Father   . Diabetes Mother   . Kidney disease Mother   . Arrhythmia Brother   . Hypertension Brother   . Diabetes Brother   . Hypertension Brother   . Cancer Brother     SOCIAL HISTORY: Social History   Socioeconomic History  . Marital status: Legally Separated    Spouse name: Not on file  . Number of children: 6  . Years of education: Not on file  . Highest education level: Not on file  Occupational History  . Not on file  Tobacco Use  . Smoking status: Former Smoker    Packs/day: 0.25    Years: 20.00    Pack years: 5.00    Types: Cigarettes    Quit date: 02/06/2006    Years since quitting: 14.3  . Smokeless tobacco: Never Used  Vaping Use  . Vaping Use: Never used  Substance and Sexual Activity  . Alcohol use: Yes    Comment: 2 times monthly "if that"  . Drug use: No  . Sexual activity: Not on file  Other Topics Concern  . Not on file  Social History Narrative  . Not on file   Social Determinants of Health   Financial Resource Strain: Not on file  Food Insecurity: Not on file  Transportation Needs: Not  on file  Physical Activity: Not on file  Stress: Not on file  Social Connections: Not on file  Intimate Partner Violence: Not on file      PHYSICAL EXAM  Vitals:   05/26/20 1505  BP: 114/68  Pulse: 64  Weight: 177 lb (80.3 kg)  Height: 5\' 5"  (1.651 m)   Body mass index is 29.45 kg/m.  Generalized: Well developed, in no acute distress  Cardiology: normal rate and rhythm, no murmur noted Respiratory: clear to auscultation bilaterally  Neurological examination  Mentation: Alert oriented to time, place, history taking. Follows all commands speech and language fluent Cranial nerve II-XII: Pupils were equal round reactive to light. Extraocular movements were full, visual field were full  Motor: The motor testing reveals 5 over 5 strength of all 4 extremities. Good symmetric motor tone is noted throughout.  Gait and station: Gait is normal.    DIAGNOSTIC DATA (LABS, IMAGING, TESTING) - I reviewed patient records, labs, notes, testing and imaging myself where available.  No flowsheet data found.   No results found for: WBC, HGB, HCT, MCV, PLT No results found for: NA, K, CL, CO2, GLUCOSE, BUN, CREATININE, CALCIUM, PROT, ALBUMIN, AST, ALT, ALKPHOS, BILITOT, GFRNONAA, GFRAA No results found for: CHOL, HDL, LDLCALC, LDLDIRECT, TRIG, CHOLHDL No results found for: [id number] No results found for: VITAMINB12 No results found for: TSH     ASSESSMENT AND PLAN 70 y.o. year old male  has a past medical history of Gout, Hyperlipidemia, Hypertension, and Vitamin D deficiency. here with     ICD-10-CM   1. OSA on CPAP  G47.33 For home use only DME continuous positive airway pressure (CPAP)   Z99.89      Steven NajjarLarry has had some difficulty with compliance over the past 6 months. It is unclear why. He reports he is using it when he is sleeping but there is a period of time  from mid February to end of March where no data was recorded. We have reviewed diagnosis of sleep apnea and adverse effects of  untreated apnea. I have encouraged him to use CPAP anytime he is asleep. I will reprint download in 4 weeks to assess compliance. Compliance looks much better over the past 20 days and he was previously meeting goal of grater than 70% compliance at last follow up. He wishes to continue paying cash for supplies as insurance does not provide great coverage. He will work on healthy lifestyle habits.  He will return to see me in 1 year, sooner if needed.  He verbalizes understanding and agreement with this plan.   Orders Placed This Encounter  Procedures  . For home use only DME continuous positive airway pressure (CPAP)    Supplies    Order Specific Question:   Length of Need    Answer:   [employer]    Order Specific Question:   Patient has OSA or probable OSA    Answer:   Yes    Order Specific Question:   Is the patient currently using CPAP in the home    Answer:   Yes    Order Specific Question:   Settings    Answer:   Other see comments    Order Specific Question:   CPAP supplies needed    Answer:   Mask, headgear, cushions, filters, heated tubing and water chamber     No orders of the defined types were placed in this encounter.      Shawnie Dapper, FNP-C 05/26/2020, 3:35 PM [facility] [address], [address] [place], [place] [address] [phone]  I reviewed the above note and documentation by the Nurse Practitioner and agree with the history, exam, assessment and plan as outlined above. I was available for consultation. Huston Foley, MD, PhD [facility])

## 2020-05-25 NOTE — Patient Instructions (Addendum)
Please continue using your CPAP regularly. While your insurance requires that you use CPAP at least 4 hours each night on 70% of the nights, I recommend, that you not skip any nights and use it throughout the night if you can. Getting used to CPAP and staying with the treatment long term does take time and patience and discipline. Untreated obstructive sleep apnea when it is moderate to severe can have an adverse impact on cardiovascular health and raise her risk for heart disease, arrhythmias, hypertension, congestive heart failure, stroke and diabetes. Untreated obstructive sleep apnea causes sleep disruption, nonrestorative sleep, and sleep deprivation. This can have an impact on your day to day functioning and cause daytime sleepiness and impairment of cognitive function, memory loss, mood disturbance, and problems focussing. Using CPAP regularly can improve these symptoms.  I will reprint download in 4 weeks. Work on compliance. If compliance report looks good in 4 weeks, we will see you back in 1 year. Please let me know if you have any concerns.    Sleep Apnea Sleep apnea affects breathing during sleep. It causes breathing to stop for a short time or to become shallow. It can also increase the risk of:  Heart attack.  Stroke.  Being very overweight (obese).  Diabetes.  Heart failure.  Irregular heartbeat. The goal of treatment is to help you breathe normally again. What are the causes? There are three kinds of sleep apnea:  Obstructive sleep apnea. This is caused by a blocked or collapsed airway.  Central sleep apnea. This happens when the brain does not send the right signals to the muscles that control breathing.  Mixed sleep apnea. This is a combination of obstructive and central sleep apnea. The most common cause of this condition is a collapsed or blocked airway. This can happen if:  Your throat muscles are too relaxed.  Your tongue and tonsils are too large.  You are  overweight.  Your airway is too small.   What increases the risk?  Being overweight.  Smoking.  Having a small airway.  Being older.  Being male.  Drinking alcohol.  Taking medicines to calm yourself (sedatives or tranquilizers).  Having family members with the condition. What are the signs or symptoms?  Trouble staying asleep.  Being sleepy or tired during the day.  Getting angry a lot.  Loud snoring.  Headaches in the morning.  Not being able to focus your mind (concentrate).  Forgetting things.  Less interest in sex.  Mood swings.  Personality changes.  Feelings of sadness (depression).  Waking up a lot during the night to pee (urinate).  Dry mouth.  Sore throat. How is this diagnosed?  Your medical history.  A physical exam.  A test that is done when you are sleeping (sleep study). The test is most often done in a sleep lab but may also be done at home. How is this treated?  Sleeping on your side.  Using a medicine to get rid of mucus in your nose (decongestant).  Avoiding the use of alcohol, medicines to help you relax, or certain pain medicines (narcotics).  Losing weight, if needed.  Changing your diet.  Not smoking.  Using a machine to open your airway while you sleep, such as: ? An oral appliance. This is a mouthpiece that shifts your lower jaw forward. ? A CPAP device. This device blows air through a mask when you breathe out (exhale). ? An EPAP device. This has valves that you put in each  nostril. ? A BPAP device. This device blows air through a mask when you breathe in (inhale) and breathe out.  Having surgery if other treatments do not work. It is important to get treatment for sleep apnea. Without treatment, it can lead to:  High blood pressure.  Coronary artery disease.  In men, not being able to have an erection (impotence).  Reduced thinking ability.   Follow these instructions at home: Lifestyle  Make changes  that your doctor recommends.  Eat a healthy diet.  Lose weight if needed.  Avoid alcohol, medicines to help you relax, and some pain medicines.  Do not use any products that contain nicotine or tobacco, such as cigarettes, e-cigarettes, and chewing tobacco. If you need help quitting, ask your doctor. General instructions  Take over-the-counter and prescription medicines only as told by your doctor.  If you were given a machine to use while you sleep, use it only as told by your doctor.  If you are having surgery, make sure to tell your doctor you have sleep apnea. You may need to bring your device with you.  Keep all follow-up visits as told by your doctor. This is important. Contact a doctor if:  The machine that you were given to use during sleep bothers you or does not seem to be working.  You do not get better.  You get worse. Get help right away if:  Your chest hurts.  You have trouble breathing in enough air.  You have an uncomfortable feeling in your back, arms, or stomach.  You have trouble talking.  One side of your body feels weak.  A part of your face is hanging down. These symptoms may be an emergency. Do not wait to see if the symptoms will go away. Get medical help right away. Call your local emergency services (911 in the [place] Do not drive yourself to the hospital. Summary  This condition affects breathing during sleep.  The most common cause is a collapsed or blocked airway.  The goal of treatment is to help you breathe normally while you sleep. This information is not intended to replace advice given to you by your health care provider. Make sure you discuss any questions you have with your health care provider. Document Revised: 10/20/2017 Document Reviewed: 08/29/2017 Elsevier Patient Education  2021 [employer].

## 2020-05-26 ENCOUNTER — Encounter: Payer: Self-pay | Admitting: Family Medicine

## 2020-05-26 ENCOUNTER — Ambulatory Visit: Payer: Medicare HMO | Admitting: Family Medicine

## 2020-05-26 VITALS — BP 114/68 | HR 64 | Ht 65.0 in | Wt 177.0 lb

## 2020-05-26 DIAGNOSIS — G4733 Obstructive sleep apnea (adult) (pediatric): Secondary | ICD-10-CM | POA: Diagnosis not present

## 2020-05-26 DIAGNOSIS — Z9989 Dependence on other enabling machines and devices: Secondary | ICD-10-CM | POA: Diagnosis not present

## 2020-06-17 DIAGNOSIS — I10 Essential (primary) hypertension: Secondary | ICD-10-CM | POA: Diagnosis not present

## 2020-07-07 ENCOUNTER — Telehealth: Payer: Self-pay | Admitting: Family Medicine

## 2020-07-07 DIAGNOSIS — H44113 Panuveitis, bilateral: Secondary | ICD-10-CM | POA: Diagnosis not present

## 2020-07-07 DIAGNOSIS — H30033 Focal chorioretinal inflammation, peripheral, bilateral: Secondary | ICD-10-CM | POA: Diagnosis not present

## 2020-07-07 DIAGNOSIS — Z79899 Other long term (current) drug therapy: Secondary | ICD-10-CM | POA: Diagnosis not present

## 2020-07-07 DIAGNOSIS — H30102 Unspecified disseminated chorioretinal inflammation, left eye: Secondary | ICD-10-CM | POA: Diagnosis not present

## 2020-07-07 DIAGNOSIS — H3581 Retinal edema: Secondary | ICD-10-CM | POA: Diagnosis not present

## 2020-07-07 DIAGNOSIS — H2702 Aphakia, left eye: Secondary | ICD-10-CM | POA: Diagnosis not present

## 2020-07-07 NOTE — Telephone Encounter (Signed)
Called and spoke w/ pt. Relayed AL,NP message. Pt verbalized understanding and appreciation.

## 2020-07-07 NOTE — Telephone Encounter (Signed)
Please let him know that his compliance report looks much better! I am very proud of him. Remind him to continue using CPAP daily and for greater than 4 hours each day. We will see him back in 1 year.

## 2020-07-11 ENCOUNTER — Other Ambulatory Visit: Payer: Self-pay | Admitting: Cardiology

## 2020-07-11 DIAGNOSIS — I10 Essential (primary) hypertension: Secondary | ICD-10-CM

## 2020-07-16 DIAGNOSIS — I10 Essential (primary) hypertension: Secondary | ICD-10-CM | POA: Diagnosis not present

## 2020-07-21 DIAGNOSIS — I10 Essential (primary) hypertension: Secondary | ICD-10-CM | POA: Diagnosis not present

## 2020-07-31 ENCOUNTER — Other Ambulatory Visit: Payer: Self-pay | Admitting: Cardiology

## 2020-08-21 DIAGNOSIS — I10 Essential (primary) hypertension: Secondary | ICD-10-CM | POA: Diagnosis not present

## 2020-08-24 ENCOUNTER — Other Ambulatory Visit: Payer: Self-pay | Admitting: Cardiology

## 2020-08-24 DIAGNOSIS — I480 Paroxysmal atrial fibrillation: Secondary | ICD-10-CM

## 2020-09-14 ENCOUNTER — Other Ambulatory Visit: Payer: Self-pay | Admitting: Cardiology

## 2020-09-14 DIAGNOSIS — I480 Paroxysmal atrial fibrillation: Secondary | ICD-10-CM

## 2020-09-21 DIAGNOSIS — I10 Essential (primary) hypertension: Secondary | ICD-10-CM | POA: Diagnosis not present

## 2020-09-25 DIAGNOSIS — E782 Mixed hyperlipidemia: Secondary | ICD-10-CM | POA: Diagnosis not present

## 2020-10-02 DIAGNOSIS — E559 Vitamin D deficiency, unspecified: Secondary | ICD-10-CM | POA: Diagnosis not present

## 2020-10-02 DIAGNOSIS — H3581 Retinal edema: Secondary | ICD-10-CM | POA: Diagnosis not present

## 2020-10-02 DIAGNOSIS — I700 Atherosclerosis of aorta: Secondary | ICD-10-CM | POA: Diagnosis not present

## 2020-10-02 DIAGNOSIS — Z79899 Other long term (current) drug therapy: Secondary | ICD-10-CM | POA: Diagnosis not present

## 2020-10-02 DIAGNOSIS — I10 Essential (primary) hypertension: Secondary | ICD-10-CM | POA: Diagnosis not present

## 2020-10-02 DIAGNOSIS — M1A0790 Idiopathic chronic gout, unspecified ankle and foot, without tophus (tophi): Secondary | ICD-10-CM | POA: Diagnosis not present

## 2020-10-02 DIAGNOSIS — H44113 Panuveitis, bilateral: Secondary | ICD-10-CM | POA: Diagnosis not present

## 2020-10-02 DIAGNOSIS — H30102 Unspecified disseminated chorioretinal inflammation, left eye: Secondary | ICD-10-CM | POA: Diagnosis not present

## 2020-10-02 DIAGNOSIS — H30033 Focal chorioretinal inflammation, peripheral, bilateral: Secondary | ICD-10-CM | POA: Diagnosis not present

## 2020-10-02 DIAGNOSIS — K219 Gastro-esophageal reflux disease without esophagitis: Secondary | ICD-10-CM | POA: Diagnosis not present

## 2020-10-02 DIAGNOSIS — I480 Paroxysmal atrial fibrillation: Secondary | ICD-10-CM | POA: Diagnosis not present

## 2020-10-02 DIAGNOSIS — G4733 Obstructive sleep apnea (adult) (pediatric): Secondary | ICD-10-CM | POA: Diagnosis not present

## 2020-10-02 DIAGNOSIS — H2702 Aphakia, left eye: Secondary | ICD-10-CM | POA: Diagnosis not present

## 2020-10-02 DIAGNOSIS — M109 Gout, unspecified: Secondary | ICD-10-CM | POA: Diagnosis not present

## 2020-10-02 DIAGNOSIS — E782 Mixed hyperlipidemia: Secondary | ICD-10-CM | POA: Diagnosis not present

## 2020-10-21 DIAGNOSIS — I10 Essential (primary) hypertension: Secondary | ICD-10-CM | POA: Diagnosis not present

## 2021-03-12 DIAGNOSIS — I10 Essential (primary) hypertension: Secondary | ICD-10-CM | POA: Diagnosis not present

## 2021-03-12 DIAGNOSIS — I480 Paroxysmal atrial fibrillation: Secondary | ICD-10-CM | POA: Diagnosis not present

## 2021-03-12 DIAGNOSIS — I700 Atherosclerosis of aorta: Secondary | ICD-10-CM | POA: Diagnosis not present

## 2021-03-12 DIAGNOSIS — M1A0790 Idiopathic chronic gout, unspecified ankle and foot, without tophus (tophi): Secondary | ICD-10-CM | POA: Diagnosis not present

## 2021-03-12 DIAGNOSIS — Z0000 Encounter for general adult medical examination without abnormal findings: Secondary | ICD-10-CM | POA: Diagnosis not present

## 2021-03-12 DIAGNOSIS — R5383 Other fatigue: Secondary | ICD-10-CM | POA: Diagnosis not present

## 2021-03-12 DIAGNOSIS — E782 Mixed hyperlipidemia: Secondary | ICD-10-CM | POA: Diagnosis not present

## 2021-03-19 DIAGNOSIS — M1A0790 Idiopathic chronic gout, unspecified ankle and foot, without tophus (tophi): Secondary | ICD-10-CM | POA: Diagnosis not present

## 2021-03-19 DIAGNOSIS — E782 Mixed hyperlipidemia: Secondary | ICD-10-CM | POA: Diagnosis not present

## 2021-03-19 DIAGNOSIS — D6869 Other thrombophilia: Secondary | ICD-10-CM | POA: Diagnosis not present

## 2021-03-19 DIAGNOSIS — G4733 Obstructive sleep apnea (adult) (pediatric): Secondary | ICD-10-CM | POA: Diagnosis not present

## 2021-03-19 DIAGNOSIS — Z0000 Encounter for general adult medical examination without abnormal findings: Secondary | ICD-10-CM | POA: Diagnosis not present

## 2021-03-19 DIAGNOSIS — I480 Paroxysmal atrial fibrillation: Secondary | ICD-10-CM | POA: Diagnosis not present

## 2021-03-19 DIAGNOSIS — M109 Gout, unspecified: Secondary | ICD-10-CM | POA: Diagnosis not present

## 2021-03-19 DIAGNOSIS — I10 Essential (primary) hypertension: Secondary | ICD-10-CM | POA: Diagnosis not present

## 2021-03-19 DIAGNOSIS — I700 Atherosclerosis of aorta: Secondary | ICD-10-CM | POA: Diagnosis not present

## 2021-03-19 DIAGNOSIS — E559 Vitamin D deficiency, unspecified: Secondary | ICD-10-CM | POA: Diagnosis not present

## 2021-05-13 ENCOUNTER — Other Ambulatory Visit: Payer: Self-pay | Admitting: Cardiology

## 2021-05-13 DIAGNOSIS — I480 Paroxysmal atrial fibrillation: Secondary | ICD-10-CM

## 2021-05-26 ENCOUNTER — Ambulatory Visit: Payer: Medicare HMO | Admitting: Family Medicine

## 2021-06-08 ENCOUNTER — Encounter: Payer: Self-pay | Admitting: Gastroenterology

## 2021-07-14 ENCOUNTER — Ambulatory Visit: Payer: Medicare HMO | Admitting: Gastroenterology

## 2021-07-14 ENCOUNTER — Encounter: Payer: Self-pay | Admitting: Gastroenterology

## 2021-07-14 VITALS — BP 128/64 | HR 60 | Ht 64.0 in | Wt 174.4 lb

## 2021-07-14 DIAGNOSIS — Z800 Family history of malignant neoplasm of digestive organs: Secondary | ICD-10-CM

## 2021-07-14 DIAGNOSIS — Z1211 Encounter for screening for malignant neoplasm of colon: Secondary | ICD-10-CM

## 2021-07-14 MED ORDER — NA SULFATE-K SULFATE-MG SULF 17.5-3.13-1.6 GM/177ML PO SOLN: 1.0000 | ORAL | 0 refills | Status: AC

## 2021-07-14 NOTE — Patient Instructions (Signed)
If you are age 71 or older, your body mass index should be between 23-30. Your Body mass index is 29.93 kg/m. If this is out of the aforementioned range listed, please consider follow up with your Primary Care Provider. ________________________________________________________  The [facility] providers would like to encourage you to use [facility] to communicate with providers for non-urgent requests or questions.  Due to long hold times on the telephone, sending your provider a message by [facility] may be a faster and more efficient way to get a response.  Please allow 48 business hours for a response.  Please remember that this is for non-urgent requests.  _______________________________________________________  Steven Brennan have been scheduled for a colonoscopy. Please follow written instructions given to you at your visit today.  Please pick up your prep supplies at the pharmacy within the next 1-3 days. If you use inhalers (even only as needed), please bring them with you on the day of your procedure.  Due to recent changes in healthcare laws, you may see the results of your imaging and laboratory studies on MyChart before your provider has had a chance to review them.  We understand that in some cases there may be results that are confusing or concerning to you. Not all laboratory results come back in the same time frame and the provider may be waiting for multiple results in order to interpret others.  Please give [place] 48 hours in order for your provider to thoroughly review all the results before contacting the office for clarification of your results.   Please make sure you are taking fiber daily.  Thank you for entrusting me with your care and choosing [facility].  Dr Christella Hartigan

## 2021-07-14 NOTE — Progress Notes (Signed)
Review of pertinent gastrointestinal problems: 1.  Family history of colon cancer.  His father had colon cancer.  Colonoscopy 03/2013 Dr. Christella Hartigan showed diverticulosis only.   HPI: This is a very pleasant 71 year old man whom I last saw 8 years ago.   He is on Xarelto  I last saw him about 8 years ago the time of colonoscopy.  See the results summarized above.  He has been very bothered by constipation, probably for at least 10 years.  He will periodically take fiber supplements or MiraLAX and this usually seems to help.  He does not see blood in his stool, his weight is overall stable.  Colon cancer does run in his family, he is not sure how old his father was when he was diagnosed with colon cancer however.  As far as his constipation goes he will have a bowel movement about once or twice a week, usually has to really push and strain to get it out.  Review of systems: Pertinent positive and negative review of systems were noted in the above HPI section. All other review negative.   Past Medical History:  Diagnosis Date   Arthritis    Cardiac arrhythmia    Diverticulosis    GERD (gastroesophageal reflux disease)    Gout    Gout    Hyperlipidemia    Hypertension    Sleep apnea    Vitamin D deficiency     Past Surgical History:  Procedure Laterality Date   EYE SURGERY Left 01/18/1991   Lens removal due to injury   INTRAOCULAR LENS INSERTION Left 2021    Current Outpatient Medications  Medication Instructions   acetaminophen (TYLENOL) 650 mg, Oral, Every 6 hours PRN   allopurinol (ZYLOPRIM) 100 mg, Oral, Daily   amLODipine (NORVASC) 5 MG tablet TAKE 1 TABLET BY MOUTH EVERY DAY   atorvastatin (LIPITOR) 40 mg, Oral, Daily   Docusate Sodium (STOOL SOFTENER LAXATIVE PO) 2 tablets, Oral, 2 times daily PRN   folic acid (FOLVITE) 1 mg, Oral, Daily, Take 5 tablet   methotrexate (RHEUMATREX) 15 mg, Oral, Weekly   metoprolol tartrate (LOPRESSOR) 25 MG tablet TAKE 1 TABLET BY MOUTH  TWICE A DAY   Misc Natural Products (DETOX PO) 1 Dose, Oral, As needed, Detox Tea<BR>As needed<BR>   multivitamin-iron-minerals-folic acid (CENTRUM) chewable tablet 1 tablet, Oral, Daily   pantoprazole (PROTONIX) 40 mg, Oral, Daily   psyllium (METAMUCIL) 58.6 % packet 1 packet, Oral, Daily   sildenafil (VIAGRA) 100 mg, Oral, Daily PRN   telmisartan-hydrochlorothiazide (MICARDIS HCT) 80-12.5 MG tablet 1 tablet, Oral, Daily, Taking it in the morning   XARELTO 20 MG TABS tablet TAKE 1 TABLET BY MOUTH DAILY WITH SUPPER    Allergies as of 07/14/2021 - Review Complete 07/14/2021  Allergen Reaction Noted   Ibuprofen  03/19/2021   Trazodone and nefazodone  02/07/2019    Family History  Problem Relation Age of Onset   Diabetes Mother    Kidney disease Mother    Colon cancer Father    Arrhythmia Brother    Hypertension Brother    Diabetes Brother    Hypertension Brother    Throat cancer Brother    Lupus Daughter    Hypertension Daughter     Social History   Socioeconomic History   Marital status: Legally Separated    Spouse name: Not on file   Number of children: 4   Years of education: Not on file   Highest education level: Not on file  Occupational History  Occupation: retired  Tobacco Use   Smoking status: Former    Packs/day: 0.25    Years: 20.00    Total pack years: 5.00    Types: Cigarettes    Quit date: 02/06/2006    Years since quitting: 15.4   Smokeless tobacco: Never  Vaping Use   Vaping Use: Never used  Substance and Sexual Activity   Alcohol use: Yes    Comment: 2 times monthly "if that"   Drug use: No   Sexual activity: Not on file  Other Topics Concern   Not on file  Social History Narrative   Not on file   Social Determinants of Health   Financial Resource Strain: Not on file  Food Insecurity: Not on file  Transportation Needs: Not on file  Physical Activity: Not on file  Stress: Not on file  Social Connections: Not on file  Intimate Partner  Violence: Not on file     Physical Exam: Ht 5\' 4"  (1.626 m) Comment: height measured without shoes  Wt 174 lb 6 oz (79.1 kg)   BMI 29.93 kg/m  Constitutional: generally well-appearing Psychiatric: alert and oriented x3 Eyes: extraocular movements intact Mouth: oral pharynx moist, no lesions Neck: supple no lymphadenopathy Cardiovascular: heart regular rate and rhythm Lungs: clear to auscultation bilaterally Abdomen: soft, nontender, nondistended, no obvious ascites, no peritoneal signs, normal bowel sounds Extremities: no lower extremity edema bilaterally Skin: no lesions on visible extremities   Assessment and plan: 71 y.o. male with family history colon cancer, chronic constipation  He is "overdue" for colon cancer screening given his family history of colon cancer.  We will arrange for colonoscopy at his soonest convenience.  I recommended he start taking fiber supplements on an every day basis rather than just once in a while for his chronic constipation.  I see no reason for any further blood tests or imaging studies, he understands that he is on a blood thinner that increases his risk for procedure related complications during the colonoscopy and so I asked that he stop his medicine for 2 days prior to the colonoscopy we will make sure that his cardiologist is okay with that recommendation.    Please see the "Patient Instructions" section for addition details about the plan.   62, MD [facility] 07/14/2021, 11:17 AM  Cc: 07/16/2021*  Total time on date of encounter was 46  minutes (this included time spent preparing to see the patient reviewing records; obtaining and/or reviewing separately obtained history; performing a medically appropriate exam and/or evaluation; counseling and educating the patient and family if present; ordering medications, tests or procedures if applicable; and documenting clinical information in the health  record).

## 2021-07-19 ENCOUNTER — Telehealth: Payer: Self-pay

## 2021-07-19 NOTE — Telephone Encounter (Signed)
   Steven Brennan 06/15/1950 [id number]  Dear Dr Rosemary Holms:  We have scheduled the above named patient for a colonoscopy procedure. Our records show that he is on anticoagulation therapy.  Please advise as to whether the patient may come off their therapy of Xarelto  2 days prior to their procedure which is scheduled for 09-17-21.  Please route your response to Blondell Reveal, CMA or fax response to [phone]  Sincerely,    [facility]

## 2021-07-22 ENCOUNTER — Encounter: Payer: Self-pay | Admitting: Cardiology

## 2021-07-22 NOTE — Telephone Encounter (Signed)
Left message for patient to return call to further discuss Xarelto hold prior to colonoscopy scheduled for September. Will continue efforts.

## 2021-07-22 NOTE — Progress Notes (Signed)
Left message for patient to return call to further discuss Xarelto hold prior to colonoscopy in September.  Will continue efforts.

## 2021-07-23 NOTE — Telephone Encounter (Signed)
Left message for patient to return call.  Will continue efforts.  

## 2021-07-26 NOTE — Telephone Encounter (Signed)
Patient advised that he has been given clearance to hold Xarelto 2 days prior to colonoscopy scheduled for 09-17-21.  Patient advised to take last dose of Xarelto on 09-14-21, and he will be advised when to restart Xarelto by Dr Christella Hartigan after the procedure.  Patient agreed to plan and verbalized understanding.  No further questions.

## 2021-08-20 DIAGNOSIS — G4733 Obstructive sleep apnea (adult) (pediatric): Secondary | ICD-10-CM | POA: Diagnosis not present

## 2021-08-20 DIAGNOSIS — M109 Gout, unspecified: Secondary | ICD-10-CM | POA: Diagnosis not present

## 2021-08-20 DIAGNOSIS — I700 Atherosclerosis of aorta: Secondary | ICD-10-CM | POA: Diagnosis not present

## 2021-08-20 DIAGNOSIS — D6869 Other thrombophilia: Secondary | ICD-10-CM | POA: Diagnosis not present

## 2021-08-20 DIAGNOSIS — I480 Paroxysmal atrial fibrillation: Secondary | ICD-10-CM | POA: Diagnosis not present

## 2021-08-20 DIAGNOSIS — I10 Essential (primary) hypertension: Secondary | ICD-10-CM | POA: Diagnosis not present

## 2021-08-20 DIAGNOSIS — E782 Mixed hyperlipidemia: Secondary | ICD-10-CM | POA: Diagnosis not present

## 2021-08-27 DIAGNOSIS — K219 Gastro-esophageal reflux disease without esophagitis: Secondary | ICD-10-CM | POA: Diagnosis not present

## 2021-08-27 DIAGNOSIS — M109 Gout, unspecified: Secondary | ICD-10-CM | POA: Diagnosis not present

## 2021-08-27 DIAGNOSIS — I480 Paroxysmal atrial fibrillation: Secondary | ICD-10-CM | POA: Diagnosis not present

## 2021-08-27 DIAGNOSIS — M1A0790 Idiopathic chronic gout, unspecified ankle and foot, without tophus (tophi): Secondary | ICD-10-CM | POA: Diagnosis not present

## 2021-08-27 DIAGNOSIS — I10 Essential (primary) hypertension: Secondary | ICD-10-CM | POA: Diagnosis not present

## 2021-08-27 DIAGNOSIS — G4733 Obstructive sleep apnea (adult) (pediatric): Secondary | ICD-10-CM | POA: Diagnosis not present

## 2021-08-27 DIAGNOSIS — D6869 Other thrombophilia: Secondary | ICD-10-CM | POA: Diagnosis not present

## 2021-08-27 DIAGNOSIS — I700 Atherosclerosis of aorta: Secondary | ICD-10-CM | POA: Diagnosis not present

## 2021-08-27 DIAGNOSIS — E559 Vitamin D deficiency, unspecified: Secondary | ICD-10-CM | POA: Diagnosis not present

## 2021-08-27 DIAGNOSIS — E782 Mixed hyperlipidemia: Secondary | ICD-10-CM | POA: Diagnosis not present

## 2021-09-17 ENCOUNTER — Encounter: Payer: Medicare HMO | Admitting: Gastroenterology

## 2021-10-19 ENCOUNTER — Telehealth: Payer: Self-pay | Admitting: Gastroenterology

## 2021-10-19 NOTE — Telephone Encounter (Signed)
The pt has been advised to hold his xarelto 2 days before his procedure.  The pt has been advised of the information and verbalized understanding.

## 2021-10-19 NOTE — Telephone Encounter (Signed)
Inbound call from patient ,he have a procedure schedule 10/29/2021 wants to know when he need to stop taking his blood thinner med .Please advise thanks

## 2021-10-22 ENCOUNTER — Encounter: Payer: Self-pay | Admitting: Gastroenterology

## 2021-10-23 ENCOUNTER — Encounter: Payer: Self-pay | Admitting: Certified Registered Nurse Anesthetist

## 2021-10-29 ENCOUNTER — Encounter: Payer: Medicare HMO | Admitting: Gastroenterology

## 2021-11-02 DIAGNOSIS — S139XXA Sprain of joints and ligaments of unspecified parts of neck, initial encounter: Secondary | ICD-10-CM | POA: Diagnosis not present

## 2021-11-22 DIAGNOSIS — E782 Mixed hyperlipidemia: Secondary | ICD-10-CM | POA: Diagnosis not present

## 2021-11-22 DIAGNOSIS — I700 Atherosclerosis of aorta: Secondary | ICD-10-CM | POA: Diagnosis not present

## 2021-11-22 DIAGNOSIS — I10 Essential (primary) hypertension: Secondary | ICD-10-CM | POA: Diagnosis not present

## 2021-11-26 DIAGNOSIS — M1A0790 Idiopathic chronic gout, unspecified ankle and foot, without tophus (tophi): Secondary | ICD-10-CM | POA: Diagnosis not present

## 2021-11-26 DIAGNOSIS — E559 Vitamin D deficiency, unspecified: Secondary | ICD-10-CM | POA: Diagnosis not present

## 2021-11-26 DIAGNOSIS — D6869 Other thrombophilia: Secondary | ICD-10-CM | POA: Diagnosis not present

## 2021-11-26 DIAGNOSIS — M109 Gout, unspecified: Secondary | ICD-10-CM | POA: Diagnosis not present

## 2021-11-26 DIAGNOSIS — I480 Paroxysmal atrial fibrillation: Secondary | ICD-10-CM | POA: Diagnosis not present

## 2021-11-26 DIAGNOSIS — I700 Atherosclerosis of aorta: Secondary | ICD-10-CM | POA: Diagnosis not present

## 2021-11-26 DIAGNOSIS — E782 Mixed hyperlipidemia: Secondary | ICD-10-CM | POA: Diagnosis not present

## 2021-11-26 DIAGNOSIS — K219 Gastro-esophageal reflux disease without esophagitis: Secondary | ICD-10-CM | POA: Diagnosis not present

## 2021-11-26 DIAGNOSIS — I10 Essential (primary) hypertension: Secondary | ICD-10-CM | POA: Diagnosis not present

## 2021-11-26 DIAGNOSIS — G4733 Obstructive sleep apnea (adult) (pediatric): Secondary | ICD-10-CM | POA: Diagnosis not present

## 2022-01-13 DIAGNOSIS — U071 COVID-19: Secondary | ICD-10-CM | POA: Diagnosis not present

## 2022-01-23 ENCOUNTER — Ambulatory Visit: Admission: EM | Admit: 2022-01-23 | Payer: Medicare HMO | Source: Home / Self Care

## 2022-01-24 ENCOUNTER — Ambulatory Visit
Admission: EM | Admit: 2022-01-24 | Discharge: 2022-01-24 | Disposition: A | Discharge status: Home / Self Care | Payer: Medicare HMO | Attending: Emergency Medicine | Admitting: Emergency Medicine

## 2022-01-24 DIAGNOSIS — L0291 Cutaneous abscess, unspecified: Secondary | ICD-10-CM

## 2022-01-24 MED ORDER — DOXYCYCLINE HYCLATE 100 MG PO CAPS: 100.0000 mg | ORAL_CAPSULE | Freq: Two times a day (BID) | ORAL | 0 refills | Status: DC

## 2022-01-24 NOTE — Discharge Instructions (Addendum)
Patient will need to have a follow-up in 24 to 48 hours for area to ensure he does not need more drainage. Wash dry area apply a dressing If symptoms become worse or began to have a fever after 24 hours of antibiotic you will need to be seen in the emergency room

## 2022-01-24 NOTE — ED Provider Notes (Signed)
[facility]    CSN: [id number] Arrival date & time: 01/24/22  1159      History   Chief Complaint Chief Complaint  Patient presents with   Cyst    HPI Steven Brennan is a 72 y.o. male.   Patient presents today with an ongoing draining cyst to the mid upper back.  Patient has had this prior and has had several incision and drainage completed.  The specific area has been draining for 2 weeks patient has been manipulating with active drainage he is concerned he needs antibiotic.  Does have some pain discomfort with this but no known fevers.    Past Medical History:  Diagnosis Date   Arthritis    Cardiac arrhythmia    Diverticulosis    GERD (gastroesophageal reflux disease)    Gout    Gout    Hyperlipidemia    Hypertension    Sleep apnea    Vitamin D deficiency     Patient Active Problem List   Diagnosis Date Noted   Coronary artery disease involving native coronary artery of native heart without angina pectoris 11/02/2019   Monitoring for long-term anticoagulant use 08/12/2019   Paroxysmal atrial fibrillation ([place]) 02/18/2019   Atypical chest pain 02/08/2019   Essential hypertension 02/08/2019   Syncope and collapse 02/07/2019   Obstructive sleep apnea treated with continuous positive airway pressure (CPAP) 03/12/2018    Past Surgical History:  Procedure Laterality Date   EYE SURGERY Left 01/18/1991   Lens removal due to injury   INTRAOCULAR LENS INSERTION Left 2021       Home Medications    Prior to Admission medications   Medication Sig Start Date End Date Taking? Authorizing Provider  doxycycline (VIBRAMYCIN) 100 MG capsule Take 1 capsule (100 mg total) by mouth 2 (two) times daily. 01/24/22  Yes Marney Setting, NP  acetaminophen (TYLENOL) 325 MG tablet Take 650 mg by mouth every 6 (six) hours as needed.    [provider]  allopurinol (ZYLOPRIM) 100 MG tablet Take 100 mg by mouth daily. 09/11/18   [provider]   amLODipine (NORVASC) 5 MG tablet TAKE 1 TABLET BY MOUTH EVERY DAY 07/13/20   Patwardhan, Manish J, MD  atorvastatin (LIPITOR) 40 MG tablet Take 40 mg by mouth daily.    [provider]  Docusate Sodium (STOOL SOFTENER LAXATIVE PO) Take 2 tablets by mouth 2 (two) times daily as needed.    [provider]  folic acid (FOLVITE) 1 MG tablet Take 1 mg by mouth daily. Take 5 tablet 11/13/18   [provider]  methotrexate (RHEUMATREX) 2.5 MG tablet Take 15 mg by mouth once a week.  11/13/18   [provider]  metoprolol tartrate (LOPRESSOR) 25 MG tablet TAKE 1 TABLET BY MOUTH TWICE A DAY 09/14/20   Adrian Prows, MD  Misc Natural Products (DETOX PO) Take 1 Dose by mouth as needed. Detox Tea As needed    [provider]  multivitamin-iron-minerals-folic acid (CENTRUM) chewable tablet Chew 1 tablet by mouth daily.    [provider]  Na Sulfate-K Sulfate-Mg Sulf (SUPREP BOWEL PREP KIT) 17.5-3.13-1.6 GM/177ML SOLN Take 1 kit by mouth as directed. 07/14/21   Milus Banister, MD  pantoprazole (PROTONIX) 40 MG tablet Take 40 mg by mouth daily.    [provider]  psyllium (METAMUCIL) 58.6 % packet Take 1 packet by mouth daily.    [provider]  sildenafil (VIAGRA) 100 MG tablet Take 100 mg by  mouth daily as needed for erectile dysfunction.    [provider]  telmisartan-hydrochlorothiazide (MICARDIS HCT) 80-12.5 MG tablet Take 1 tablet by mouth daily. Taking it in the morning    [provider]  XARELTO 20 MG TABS tablet TAKE 1 TABLET BY MOUTH DAILY WITH SUPPER 08/03/20   Patwardhan, Anabel Bene, MD    Family History Family History  Problem Relation Age of Onset   Diabetes Mother    Kidney disease Mother    Colon cancer Father    Arrhythmia Brother    Hypertension Brother    Diabetes Brother    Hypertension Brother    Throat cancer Brother    Lupus Daughter    Hypertension Daughter     Social History Social  History   Tobacco Use   Smoking status: Former    Packs/day: 0.25    Years: 20.00    Total pack years: 5.00    Types: Cigarettes    Quit date: 02/06/2006    Years since quitting: 15.9   Smokeless tobacco: Never  Vaping Use   Vaping Use: Never used  Substance Use Topics   Alcohol use: Yes    Comment: 2 times monthly "if that"   Drug use: No     Allergies   Ibuprofen and Trazodone and nefazodone   Review of Systems Review of Systems  Constitutional:  Negative for fever.  Respiratory: Negative.    Cardiovascular: Negative.   Gastrointestinal: Negative.   Skin:  Positive for wound.       Quarter sized cyst to mid upper back actively drainage.  Neurological: Negative.      Physical Exam Triage Vital Signs ED Triage Vitals  Enc Vitals Group     BP 01/24/22 1218 119/72     Pulse Rate 01/24/22 1218 80     Resp 01/24/22 1218 16     Temp 01/24/22 1218 98 F (36.7 C)     Temp Source 01/24/22 1218 Oral     SpO2 01/24/22 1218 97 %     Weight --      Height --      Head Circumference --      Peak Flow --      Pain Score 01/24/22 1219 2     Pain Loc --      Pain Edu? --      Excl. in GC? --    No data found.  Updated Vital Signs BP 119/72 (BP Location: Left Arm)   Pulse 80   Temp 98 F (36.7 C) (Oral)   Resp 16   SpO2 97%   Visual Acuity Right Eye Distance:   Left Eye Distance:   Bilateral Distance:    Right Eye Near:   Left Eye Near:    Bilateral Near:     Physical Exam Constitutional:      Appearance: Normal appearance.  Cardiovascular:     Rate and Rhythm: Normal rate.  Pulmonary:     Effort: Pulmonary effort is normal.  Abdominal:     General: Abdomen is flat.  Musculoskeletal:        General: Normal range of motion.  Skin:    Capillary Refill: Capillary refill takes 2 to 3 seconds.     Findings: Erythema present.     Comments: cyst to mid upper back actively drainage yellow thick foul odor.  Slight erythema noticed 1 cm circular around  area.  Neurological:     General: No focal deficit present.  Mental Status: He is alert.      UC Treatments / Results  Labs (all labs ordered are listed, but only abnormal results are displayed) Labs Reviewed - No data to display  EKG   Radiology No results found.  Procedures Incision and Drainage  Date/Time: 01/24/2022 12:43 PM  Performed by: Coralyn Mark, NP Authorized by: Coralyn Mark, NP   Consent:    Consent obtained:  Verbal   Consent given by:  Patient   Risks discussed:  Bleeding, incomplete drainage, pain and damage to other organs   Alternatives discussed:  No treatment Universal protocol:    Procedure explained and questions answered to patient or proxy's satisfaction: yes     Relevant documents present and verified: yes     Test results available : yes     Imaging studies available: yes     Required blood products, implants, devices, and special equipment available: yes     Site/side marked: yes     Immediately prior to procedure, a time out was called: yes     Patient identity confirmed:  Verbally with patient Location:    Type:  Abscess   Location: Mid back. Pre-procedure details:    Skin preparation:  Antiseptic wash Sedation:    Sedation type:  None Anesthesia:    Anesthesia method:  None (Wound already has an opening with drainage) Procedure type:    Complexity:  Simple Procedure details:    Ultrasound guidance: no     Needle aspiration: no     Incision types:  Single straight   Incision depth:  Subcutaneous   Wound management:  Probed and deloculated, irrigated with saline and extensive cleaning   Drainage:  Purulent   Drainage amount:  Moderate   Wound treatment:  Wound left open   Packing materials:  None Post-procedure details:    Procedure completion:  Tolerated Comments:     Wound was open actively drainage manipulated area large amount of a yellow purulent drainage discharge.  (including critical care  time)  Medications Ordered in UC Medications - No data to display  Initial Impression / Assessment and Plan / UC Course  I have reviewed the triage vital signs and the nursing notes.  Pertinent labs & imaging results that were available during my care of the patient were reviewed by me and considered in my medical decision making (see chart for details).     Educated patient that he will need to be on antibiotics take full dose with food. Patient will need to have a follow-up in 24 to 48 hours for area to ensure he does not need more drainage. Wash dry area apply a dressing If symptoms become worse or began to have a fever after 24 hours of antibiotic you will need to be seen in the emergency room Final Clinical Impressions(s) / UC Diagnoses   Final diagnoses:  Abscess     Discharge Instructions      Patient will need to have a follow-up in 24 to 48 hours for area to ensure he does not need more drainage. Wash dry area apply a dressing If symptoms become worse or began to have a fever after 24 hours of antibiotic you will need to be seen in the emergency room     ED Prescriptions     Medication Sig Dispense Auth. Provider   doxycycline (VIBRAMYCIN) 100 MG capsule Take 1 capsule (100 mg total) by mouth 2 (two) times daily. 20 capsule Coralyn Mark, NP  PDMP not reviewed this encounter.   Coralyn Mark, NP 01/24/22 1250
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
## 2022-01-31 DIAGNOSIS — L729 Follicular cyst of the skin and subcutaneous tissue, unspecified: Secondary | ICD-10-CM | POA: Diagnosis not present

## 2022-02-24 DIAGNOSIS — L720 Epidermal cyst: Secondary | ICD-10-CM | POA: Diagnosis not present

## 2022-05-05 ENCOUNTER — Ambulatory Visit: Payer: Medicare HMO

## 2022-05-05 ENCOUNTER — Ambulatory Visit
Admission: EM | Admit: 2022-05-05 | Discharge: 2022-05-05 | Disposition: A | Discharge status: Home / Self Care | Payer: Medicare HMO | Attending: Family Medicine | Admitting: Family Medicine

## 2022-05-05 DIAGNOSIS — S92351A Displaced fracture of fifth metatarsal bone, right foot, initial encounter for closed fracture: Secondary | ICD-10-CM

## 2022-05-05 DIAGNOSIS — S92354A Nondisplaced fracture of fifth metatarsal bone, right foot, initial encounter for closed fracture: Secondary | ICD-10-CM | POA: Diagnosis not present

## 2022-05-05 MED ORDER — HYDROCODONE-ACETAMINOPHEN 5-325 MG PO TABS: 1.0000 | ORAL_TABLET | Freq: Four times a day (QID) | ORAL | 0 refills | Status: AC | PRN
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
## 2022-05-05 NOTE — Discharge Instructions (Addendum)
X-ray shows a broken bone at the base of your fifth metatarsal in your right. Hydrocodone 5 mg--1 tablet every 6 hours as needed for pain.  This is best taken with food.  It can cause sleepiness or dizziness

## 2022-05-09 DIAGNOSIS — H2513 Age-related nuclear cataract, bilateral: Secondary | ICD-10-CM | POA: Diagnosis not present

## 2022-05-10 ENCOUNTER — Ambulatory Visit (INDEPENDENT_AMBULATORY_CARE_PROVIDER_SITE_OTHER): Payer: Medicare HMO | Admitting: Podiatry

## 2022-05-10 DIAGNOSIS — S92354A Nondisplaced fracture of fifth metatarsal bone, right foot, initial encounter for closed fracture: Secondary | ICD-10-CM | POA: Diagnosis not present

## 2022-05-10 NOTE — Patient Instructions (Signed)
You can use an EVENup on the left side to help keep you level  Metatarsal Fracture A metatarsal fracture is a break in one of the five bones that connect the toes to the rest of the foot. This may also be called a forefoot fracture. A metatarsal fracture may be: A crack in the surface of the bone (stress fracture). This often occurs in athletes. A break all the way through the bone (complete fracture). The bone that connects to the little toe (fifth metatarsal) is most commonly fractured. Ballet dancers often fracture this bone. What are the causes? A metatarsal fracture may be caused by: Sudden twisting of the foot. Falling onto the foot. Something heavy falling onto the foot. Overuse or repetitive exercise. What increases the risk? This condition is more likely to develop in people who: Play contact sports. Do ballet. Have a condition that causes the bones to become thin and brittle (osteoporosis). Have a low calcium level. What are the signs or symptoms? Symptoms of this condition include: Pain that gets worse when walking or standing. Pain when pressing on the foot or moving the toes. Swelling. Bruising on the top or bottom of the foot. How is this diagnosed? This condition may be diagnosed based on: Your symptoms. Any recent foot injuries you have had. A physical exam. An X-ray of your foot. If you have a stress fracture, it may not show up on an X-ray, and you may need other imaging tests, such as: A bone scan. CT scan. MRI. How is this treated? Treatment depends on how severe your fracture is and how the pieces of the broken bone line up with each other (alignment). Treatment may involve: Wearing a cast, splint, or supportive boot on your foot. Using crutches, and not putting any weight on your foot. Having surgery to align broken bones (open reduction and internal fixation, ORIF). Physical therapy. Follow-up visits and X-rays to make sure you are healing. Follow these  instructions at home: If you have a splint or a supportive boot: Wear the splint or boot as told by your health care provider. Remove it only as told by your health care provider. Loosen the splint or boot if your toes tingle, become numb, or turn cold and blue. Keep the splint or boot clean. If your splint or boot is not waterproof: Do not let it get wet. Cover it with a watertight covering when you take a bath or a shower. If you have a cast: Do not stick anything inside the cast to scratch your skin. Doing that increases your risk for infection. Check the skin around the cast every day. Tell your health care provider about any concerns. You may put lotion on dry skin around the edges of the cast. Do not put lotion on the skin underneath the cast. Keep the cast clean. If the cast is not waterproof: Do not let it get wet. Cover it with a watertight covering when you take a bath or a shower. Activity Do not use your affected leg to support your body weight until your health care provider says that you can. Use crutches as directed. Ask your health care provider what activities are safe for you during recovery, and ask what activities you need to avoid. Do physical therapy exercises as directed. Driving Do not drive or use heavy machinery while taking pain medicine. Do not drive while wearing a cast, splint, or boot on a foot that you use for driving. Managing pain, stiffness, and swelling  If directed, put ice on painful areas: Put ice in a plastic bag. Place a towel between your skin and the bag. If you have a removable splint or boot, remove it as told by your health care provider. If you have a cast, place a towel between your cast and the bag. Leave the ice on for 20 minutes, 2-3 times a day. Move your toes often to avoid stiffness and to lessen swelling. Raise (elevate) your lower leg above the level of your heart while you are sitting or lying down. General instructions Do not  put pressure on any part of the cast or splint until it is fully hardened. This may take several hours. Take over-the-counter and prescription medicines only as told by your health care provider. Do not use any products that contain nicotine or tobacco, such as cigarettes and e-cigarettes. These can delay bone healing. If you need help quitting, ask your health care provider. Do not take baths, swim, or use a hot tub until your health care provider approves. Ask your health care provider if you may take showers. Keep all follow-up visits as told by your health care provider. This is important. Contact a health care provider if you have: Pain that gets worse or does not get better with medicine. A fever. A bad smell coming from your cast or splint. Get help right away if you have: Any of the following in your toes or your foot, even after loosening your splint (if applicable): Numbness. Tingling. Coldness. Blue skin. Redness or swelling that gets worse. Pain that suddenly becomes severe. Summary A metatarsal fracture is a break in one of the five bones that connect the toes to the rest of the foot. Treatment depends on how severe your fracture is and how the pieces of the broken bone line up with each other (alignment). This may include wearing a cast, splint, or supportive boot, or using crutches. Sometimes surgery is needed to align the bones. Ice and elevate your foot to help lessen the pain and swelling. Make sure you know what symptoms should cause you to get help right away. This information is not intended to replace advice given to you by your health care provider. Make sure you discuss any questions you have with your health care provider. Document Revised: 04/08/2020 Document Reviewed: 04/09/2020 Elsevier Patient Education  2023 [employer].
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
## 2022-05-23 DIAGNOSIS — I10 Essential (primary) hypertension: Secondary | ICD-10-CM | POA: Diagnosis not present

## 2022-05-23 DIAGNOSIS — M109 Gout, unspecified: Secondary | ICD-10-CM | POA: Diagnosis not present

## 2022-05-23 DIAGNOSIS — E559 Vitamin D deficiency, unspecified: Secondary | ICD-10-CM | POA: Diagnosis not present

## 2022-05-23 DIAGNOSIS — I700 Atherosclerosis of aorta: Secondary | ICD-10-CM | POA: Diagnosis not present

## 2022-05-23 DIAGNOSIS — I480 Paroxysmal atrial fibrillation: Secondary | ICD-10-CM | POA: Diagnosis not present

## 2022-05-23 DIAGNOSIS — D6869 Other thrombophilia: Secondary | ICD-10-CM | POA: Diagnosis not present

## 2022-05-23 DIAGNOSIS — G4733 Obstructive sleep apnea (adult) (pediatric): Secondary | ICD-10-CM | POA: Diagnosis not present

## 2022-05-23 DIAGNOSIS — E782 Mixed hyperlipidemia: Secondary | ICD-10-CM | POA: Diagnosis not present

## 2022-05-23 DIAGNOSIS — M1A0790 Idiopathic chronic gout, unspecified ankle and foot, without tophus (tophi): Secondary | ICD-10-CM | POA: Diagnosis not present

## 2022-05-27 DIAGNOSIS — D6869 Other thrombophilia: Secondary | ICD-10-CM | POA: Diagnosis not present

## 2022-05-27 DIAGNOSIS — S92901A Unspecified fracture of right foot, initial encounter for closed fracture: Secondary | ICD-10-CM | POA: Diagnosis not present

## 2022-05-27 DIAGNOSIS — M109 Gout, unspecified: Secondary | ICD-10-CM | POA: Diagnosis not present

## 2022-05-27 DIAGNOSIS — I700 Atherosclerosis of aorta: Secondary | ICD-10-CM | POA: Diagnosis not present

## 2022-05-27 DIAGNOSIS — N182 Chronic kidney disease, stage 2 (mild): Secondary | ICD-10-CM | POA: Diagnosis not present

## 2022-05-27 DIAGNOSIS — I480 Paroxysmal atrial fibrillation: Secondary | ICD-10-CM | POA: Diagnosis not present

## 2022-05-27 DIAGNOSIS — M1A0790 Idiopathic chronic gout, unspecified ankle and foot, without tophus (tophi): Secondary | ICD-10-CM | POA: Diagnosis not present

## 2022-05-27 DIAGNOSIS — E782 Mixed hyperlipidemia: Secondary | ICD-10-CM | POA: Diagnosis not present

## 2022-05-27 DIAGNOSIS — R7989 Other specified abnormal findings of blood chemistry: Secondary | ICD-10-CM | POA: Diagnosis not present

## 2022-06-02 ENCOUNTER — Ambulatory Visit (INDEPENDENT_AMBULATORY_CARE_PROVIDER_SITE_OTHER): Payer: Medicare HMO | Admitting: Podiatry

## 2022-06-02 ENCOUNTER — Ambulatory Visit (INDEPENDENT_AMBULATORY_CARE_PROVIDER_SITE_OTHER): Payer: Medicare HMO

## 2022-06-02 DIAGNOSIS — S92354D Nondisplaced fracture of fifth metatarsal bone, right foot, subsequent encounter for fracture with routine healing: Secondary | ICD-10-CM

## 2022-06-02 DIAGNOSIS — S92354A Nondisplaced fracture of fifth metatarsal bone, right foot, initial encounter for closed fracture: Secondary | ICD-10-CM

## 2022-06-05 NOTE — Progress Notes (Signed)
Subjective: Chief Complaint  Patient presents with   Fracture    Rm 13 3 weeks follow up right foot fracture. Pt states he is doing better but has some soreness at the top of his right foot that may be coming from the tightness of the boot.    72 year old male presents the above concerns.  No recent injury or changes otherwise.  He has no new concerns.   Objective: AAO x3, NAD DP/PT pulses palpable bilaterally, CRT less than 3 seconds Patient has significant tenderness palpation on the right fifth metatarsal base or other areas of the foot.  Describing some discomfort of the dorsal aspect of the foot likely can improve there is no significant tenderness palpation on today's exam.  Flexor, extensor tendons are intact. No pain with calf compression, swelling, warmth, erythema  Assessment: 5th Metatarsal base fracture right foot  Plan: -All treatment options discussed with the patient including all alternatives, risks, complications.  -X-rays were obtained reviewed of the right foot.  3 views of the foot were obtained.  Radiolucency centered on the fifth metatarsal base with some increased consolidation present but radiolucent line still evident.  Vessel calcification present. -We discussed both conservative as well as surgical options.  Clinically he is doing better.  Will continue with immobilization, conservative treatment.  Will continue the boot for now. -I do think the midfoot pain is coming from the boot.  Discussed not tightening the straps as much.  Vivi Barrack DPM

## 2022-06-08 DIAGNOSIS — R7989 Other specified abnormal findings of blood chemistry: Secondary | ICD-10-CM | POA: Diagnosis not present

## 2022-06-08 DIAGNOSIS — K760 Fatty (change of) liver, not elsewhere classified: Secondary | ICD-10-CM | POA: Diagnosis not present

## 2022-06-24 ENCOUNTER — Ambulatory Visit (INDEPENDENT_AMBULATORY_CARE_PROVIDER_SITE_OTHER): Payer: Medicare HMO

## 2022-06-24 ENCOUNTER — Ambulatory Visit: Payer: Medicare HMO | Admitting: Podiatry

## 2022-06-24 DIAGNOSIS — S92354D Nondisplaced fracture of fifth metatarsal bone, right foot, subsequent encounter for fracture with routine healing: Secondary | ICD-10-CM | POA: Diagnosis not present

## 2022-06-24 DIAGNOSIS — R7989 Other specified abnormal findings of blood chemistry: Secondary | ICD-10-CM | POA: Diagnosis not present

## 2022-06-30 NOTE — Progress Notes (Signed)
Subjective: Chief Complaint  Patient presents with   Fracture    Rm 13 Follow up right foot fracture. Pt states he has been feeling well no more pain.     72 year old male presents the above concerns.  States he is not having any significant pain.  He wears the boot intermittently but not all the time.  Even with wearing regular shoes he has no pain.   Objective: AAO x3, NAD DP/PT pulses palpable bilaterally, CRT less than 3 seconds Patient has no significant tenderness palpation on the right fifth metatarsal base or other areas of the foot.  No other areas of discomfort.  Flexor, extensor tendons are intact.  MMT 5/5.   No pain with calf compression, swelling, warmth, erythema  Assessment: 5th Metatarsal base fracture right foot  Plan: -All treatment options discussed with the patient including all alternatives, risks, complications.  -X-rays were obtained reviewed of the right foot.  3 views of the foot were obtained.  On the lateral view in particular there is increased consolidation noted. -Clinically he has no pain.  No significant swelling.  He is already been wearing regular shoe without any pain.  Discussed that he can gradually transition to regular shoe full-time but should there be any increasing pain or swelling to immediately turn back to the boot.  Return in about 6 weeks (around 08/05/2022), or if symptoms worsen or fail to improve, for fracture, x-ray.  Vivi Barrack DPM

## 2022-07-01 DIAGNOSIS — D6869 Other thrombophilia: Secondary | ICD-10-CM | POA: Diagnosis not present

## 2022-07-01 DIAGNOSIS — M1A0790 Idiopathic chronic gout, unspecified ankle and foot, without tophus (tophi): Secondary | ICD-10-CM | POA: Diagnosis not present

## 2022-07-01 DIAGNOSIS — I10 Essential (primary) hypertension: Secondary | ICD-10-CM | POA: Diagnosis not present

## 2022-07-01 DIAGNOSIS — I480 Paroxysmal atrial fibrillation: Secondary | ICD-10-CM | POA: Diagnosis not present

## 2022-07-01 DIAGNOSIS — I700 Atherosclerosis of aorta: Secondary | ICD-10-CM | POA: Diagnosis not present

## 2022-07-01 DIAGNOSIS — G4733 Obstructive sleep apnea (adult) (pediatric): Secondary | ICD-10-CM | POA: Diagnosis not present

## 2022-07-01 DIAGNOSIS — M109 Gout, unspecified: Secondary | ICD-10-CM | POA: Diagnosis not present

## 2022-07-01 DIAGNOSIS — E782 Mixed hyperlipidemia: Secondary | ICD-10-CM | POA: Diagnosis not present

## 2022-07-01 DIAGNOSIS — N182 Chronic kidney disease, stage 2 (mild): Secondary | ICD-10-CM | POA: Diagnosis not present

## 2022-08-23 DIAGNOSIS — H30033 Focal chorioretinal inflammation, peripheral, bilateral: Secondary | ICD-10-CM | POA: Diagnosis not present

## 2022-08-23 DIAGNOSIS — H3581 Retinal edema: Secondary | ICD-10-CM | POA: Diagnosis not present

## 2022-08-23 DIAGNOSIS — H44113 Panuveitis, bilateral: Secondary | ICD-10-CM | POA: Diagnosis not present

## 2022-08-23 DIAGNOSIS — H30102 Unspecified disseminated chorioretinal inflammation, left eye: Secondary | ICD-10-CM | POA: Diagnosis not present

## 2022-08-23 DIAGNOSIS — Z79899 Other long term (current) drug therapy: Secondary | ICD-10-CM | POA: Diagnosis not present

## 2022-08-23 DIAGNOSIS — H2702 Aphakia, left eye: Secondary | ICD-10-CM | POA: Diagnosis not present

## 2022-10-25 DIAGNOSIS — H3581 Retinal edema: Secondary | ICD-10-CM | POA: Diagnosis not present

## 2022-10-25 DIAGNOSIS — H30102 Unspecified disseminated chorioretinal inflammation, left eye: Secondary | ICD-10-CM | POA: Diagnosis not present

## 2022-10-25 DIAGNOSIS — Z79899 Other long term (current) drug therapy: Secondary | ICD-10-CM | POA: Diagnosis not present

## 2022-10-25 DIAGNOSIS — H44113 Panuveitis, bilateral: Secondary | ICD-10-CM | POA: Diagnosis not present

## 2022-10-25 DIAGNOSIS — H2702 Aphakia, left eye: Secondary | ICD-10-CM | POA: Diagnosis not present

## 2022-10-25 DIAGNOSIS — H30033 Focal chorioretinal inflammation, peripheral, bilateral: Secondary | ICD-10-CM | POA: Diagnosis not present

## 2022-11-16 DIAGNOSIS — H524 Presbyopia: Secondary | ICD-10-CM | POA: Diagnosis not present

## 2022-12-22 DIAGNOSIS — R7989 Other specified abnormal findings of blood chemistry: Secondary | ICD-10-CM | POA: Diagnosis not present

## 2022-12-22 DIAGNOSIS — I10 Essential (primary) hypertension: Secondary | ICD-10-CM | POA: Diagnosis not present

## 2022-12-22 DIAGNOSIS — K219 Gastro-esophageal reflux disease without esophagitis: Secondary | ICD-10-CM | POA: Diagnosis not present

## 2022-12-22 DIAGNOSIS — E559 Vitamin D deficiency, unspecified: Secondary | ICD-10-CM | POA: Diagnosis not present

## 2022-12-22 DIAGNOSIS — E782 Mixed hyperlipidemia: Secondary | ICD-10-CM | POA: Diagnosis not present

## 2022-12-22 DIAGNOSIS — G4733 Obstructive sleep apnea (adult) (pediatric): Secondary | ICD-10-CM | POA: Diagnosis not present

## 2022-12-22 DIAGNOSIS — M1A0790 Idiopathic chronic gout, unspecified ankle and foot, without tophus (tophi): Secondary | ICD-10-CM | POA: Diagnosis not present

## 2022-12-22 DIAGNOSIS — Z0000 Encounter for general adult medical examination without abnormal findings: Secondary | ICD-10-CM | POA: Diagnosis not present

## 2022-12-22 DIAGNOSIS — D6869 Other thrombophilia: Secondary | ICD-10-CM | POA: Diagnosis not present

## 2022-12-22 DIAGNOSIS — N182 Chronic kidney disease, stage 2 (mild): Secondary | ICD-10-CM | POA: Diagnosis not present

## 2022-12-29 DIAGNOSIS — E782 Mixed hyperlipidemia: Secondary | ICD-10-CM | POA: Diagnosis not present

## 2022-12-29 DIAGNOSIS — I700 Atherosclerosis of aorta: Secondary | ICD-10-CM | POA: Diagnosis not present

## 2022-12-29 DIAGNOSIS — Z0000 Encounter for general adult medical examination without abnormal findings: Secondary | ICD-10-CM | POA: Diagnosis not present

## 2022-12-29 DIAGNOSIS — I480 Paroxysmal atrial fibrillation: Secondary | ICD-10-CM | POA: Diagnosis not present

## 2022-12-29 DIAGNOSIS — D6869 Other thrombophilia: Secondary | ICD-10-CM | POA: Diagnosis not present

## 2022-12-29 DIAGNOSIS — I10 Essential (primary) hypertension: Secondary | ICD-10-CM | POA: Diagnosis not present

## 2022-12-29 DIAGNOSIS — G4733 Obstructive sleep apnea (adult) (pediatric): Secondary | ICD-10-CM | POA: Diagnosis not present

## 2022-12-29 DIAGNOSIS — N182 Chronic kidney disease, stage 2 (mild): Secondary | ICD-10-CM | POA: Diagnosis not present

## 2022-12-29 DIAGNOSIS — M109 Gout, unspecified: Secondary | ICD-10-CM | POA: Diagnosis not present

## 2023-01-03 DIAGNOSIS — H3581 Retinal edema: Secondary | ICD-10-CM | POA: Diagnosis not present

## 2023-01-03 DIAGNOSIS — H2702 Aphakia, left eye: Secondary | ICD-10-CM | POA: Diagnosis not present

## 2023-01-03 DIAGNOSIS — Z79899 Other long term (current) drug therapy: Secondary | ICD-10-CM | POA: Diagnosis not present

## 2023-01-03 DIAGNOSIS — H30033 Focal chorioretinal inflammation, peripheral, bilateral: Secondary | ICD-10-CM | POA: Diagnosis not present

## 2023-01-03 DIAGNOSIS — H44113 Panuveitis, bilateral: Secondary | ICD-10-CM | POA: Diagnosis not present

## 2023-01-03 DIAGNOSIS — H30102 Unspecified disseminated chorioretinal inflammation, left eye: Secondary | ICD-10-CM | POA: Diagnosis not present

## 2023-02-17 DIAGNOSIS — H5231 Anisometropia: Secondary | ICD-10-CM | POA: Diagnosis not present

## 2023-02-17 DIAGNOSIS — H524 Presbyopia: Secondary | ICD-10-CM | POA: Diagnosis not present

## 2023-02-17 DIAGNOSIS — H52203 Unspecified astigmatism, bilateral: Secondary | ICD-10-CM | POA: Diagnosis not present

## 2023-02-21 DIAGNOSIS — H52209 Unspecified astigmatism, unspecified eye: Secondary | ICD-10-CM | POA: Diagnosis not present

## 2023-02-21 DIAGNOSIS — H5213 Myopia, bilateral: Secondary | ICD-10-CM | POA: Diagnosis not present

## 2023-02-21 DIAGNOSIS — H524 Presbyopia: Secondary | ICD-10-CM | POA: Diagnosis not present

## 2023-03-15 DIAGNOSIS — H40003 Preglaucoma, unspecified, bilateral: Secondary | ICD-10-CM | POA: Diagnosis not present

## 2023-05-09 DIAGNOSIS — H3581 Retinal edema: Secondary | ICD-10-CM | POA: Diagnosis not present

## 2023-05-09 DIAGNOSIS — H44113 Panuveitis, bilateral: Secondary | ICD-10-CM | POA: Diagnosis not present

## 2023-05-09 DIAGNOSIS — Z79899 Other long term (current) drug therapy: Secondary | ICD-10-CM | POA: Diagnosis not present

## 2023-05-09 DIAGNOSIS — H401133 Primary open-angle glaucoma, bilateral, severe stage: Secondary | ICD-10-CM | POA: Diagnosis not present

## 2023-05-09 DIAGNOSIS — H2702 Aphakia, left eye: Secondary | ICD-10-CM | POA: Diagnosis not present

## 2023-05-09 DIAGNOSIS — H30102 Unspecified disseminated chorioretinal inflammation, left eye: Secondary | ICD-10-CM | POA: Diagnosis not present

## 2023-05-09 DIAGNOSIS — H30033 Focal chorioretinal inflammation, peripheral, bilateral: Secondary | ICD-10-CM | POA: Diagnosis not present

## 2023-06-05 DIAGNOSIS — K219 Gastro-esophageal reflux disease without esophagitis: Secondary | ICD-10-CM | POA: Diagnosis not present

## 2023-06-05 DIAGNOSIS — H409 Unspecified glaucoma: Secondary | ICD-10-CM | POA: Diagnosis not present

## 2023-06-05 DIAGNOSIS — I509 Heart failure, unspecified: Secondary | ICD-10-CM | POA: Diagnosis not present

## 2023-06-05 DIAGNOSIS — E785 Hyperlipidemia, unspecified: Secondary | ICD-10-CM | POA: Diagnosis not present

## 2023-06-05 DIAGNOSIS — G4733 Obstructive sleep apnea (adult) (pediatric): Secondary | ICD-10-CM | POA: Diagnosis not present

## 2023-06-05 DIAGNOSIS — I110 Hypertensive heart disease with heart failure: Secondary | ICD-10-CM | POA: Diagnosis not present

## 2023-06-05 DIAGNOSIS — D6869 Other thrombophilia: Secondary | ICD-10-CM | POA: Diagnosis not present

## 2023-06-05 DIAGNOSIS — I4891 Unspecified atrial fibrillation: Secondary | ICD-10-CM | POA: Diagnosis not present

## 2023-06-05 DIAGNOSIS — I700 Atherosclerosis of aorta: Secondary | ICD-10-CM | POA: Diagnosis not present

## 2023-06-05 DIAGNOSIS — M069 Rheumatoid arthritis, unspecified: Secondary | ICD-10-CM | POA: Diagnosis not present

## 2023-06-05 DIAGNOSIS — M109 Gout, unspecified: Secondary | ICD-10-CM | POA: Diagnosis not present

## 2023-06-14 DIAGNOSIS — H401123 Primary open-angle glaucoma, left eye, severe stage: Secondary | ICD-10-CM | POA: Diagnosis not present

## 2023-07-06 DIAGNOSIS — N182 Chronic kidney disease, stage 2 (mild): Secondary | ICD-10-CM | POA: Diagnosis not present

## 2023-07-06 DIAGNOSIS — E782 Mixed hyperlipidemia: Secondary | ICD-10-CM | POA: Diagnosis not present

## 2023-07-06 DIAGNOSIS — I10 Essential (primary) hypertension: Secondary | ICD-10-CM | POA: Diagnosis not present

## 2023-07-13 DIAGNOSIS — G4733 Obstructive sleep apnea (adult) (pediatric): Secondary | ICD-10-CM | POA: Diagnosis not present

## 2023-07-13 DIAGNOSIS — M1A0790 Idiopathic chronic gout, unspecified ankle and foot, without tophus (tophi): Secondary | ICD-10-CM | POA: Diagnosis not present

## 2023-07-13 DIAGNOSIS — I480 Paroxysmal atrial fibrillation: Secondary | ICD-10-CM | POA: Diagnosis not present

## 2023-07-13 DIAGNOSIS — M109 Gout, unspecified: Secondary | ICD-10-CM | POA: Diagnosis not present

## 2023-07-13 DIAGNOSIS — D6869 Other thrombophilia: Secondary | ICD-10-CM | POA: Diagnosis not present

## 2023-07-13 DIAGNOSIS — N182 Chronic kidney disease, stage 2 (mild): Secondary | ICD-10-CM | POA: Diagnosis not present

## 2023-07-13 DIAGNOSIS — I700 Atherosclerosis of aorta: Secondary | ICD-10-CM | POA: Diagnosis not present

## 2023-07-13 DIAGNOSIS — E559 Vitamin D deficiency, unspecified: Secondary | ICD-10-CM | POA: Diagnosis not present

## 2023-07-13 DIAGNOSIS — I10 Essential (primary) hypertension: Secondary | ICD-10-CM | POA: Diagnosis not present

## 2023-07-13 DIAGNOSIS — K219 Gastro-esophageal reflux disease without esophagitis: Secondary | ICD-10-CM | POA: Diagnosis not present

## 2023-07-13 DIAGNOSIS — E782 Mixed hyperlipidemia: Secondary | ICD-10-CM | POA: Diagnosis not present

## 2023-09-14 DIAGNOSIS — H401123 Primary open-angle glaucoma, left eye, severe stage: Secondary | ICD-10-CM | POA: Diagnosis not present
